# Patient Record
Sex: Female | Born: 1955
Health system: Southern US, Community
[De-identification: ages and names within clinical notes are randomized; demographics above are authoritative.]

## PROBLEM LIST (undated history)

## (undated) DIAGNOSIS — I10 Essential (primary) hypertension: Secondary | ICD-10-CM

## (undated) DIAGNOSIS — R896 Abnormal cytological findings in specimens from other organs, systems and tissues: Secondary | ICD-10-CM

## (undated) DIAGNOSIS — A048 Other specified bacterial intestinal infections: Secondary | ICD-10-CM

## (undated) DIAGNOSIS — K219 Gastro-esophageal reflux disease without esophagitis: Secondary | ICD-10-CM

## (undated) HISTORY — DX: Gastro-esophageal reflux disease without esophagitis: K21.9

## (undated) HISTORY — DX: Other specified bacterial intestinal infections: A04.8

## (undated) HISTORY — DX: Abnormal cytological findings in specimens from other organs, systems and tissues: R89.6

## (undated) HISTORY — DX: Essential (primary) hypertension: I10

---

## 1995-05-24 DIAGNOSIS — IMO0001 Reserved for inherently not codable concepts without codable children: Secondary | ICD-10-CM

## 1995-05-24 HISTORY — DX: Reserved for inherently not codable concepts without codable children: IMO0001

## 1999-06-03 ENCOUNTER — Other Ambulatory Visit: Admission: RE | Admit: 1999-06-03 | Discharge: 1999-06-03 | Payer: Self-pay | Admitting: Family Medicine

## 2000-07-18 ENCOUNTER — Other Ambulatory Visit: Admission: RE | Admit: 2000-07-18 | Discharge: 2000-07-18 | Payer: Self-pay | Admitting: Family Medicine

## 2001-08-02 ENCOUNTER — Other Ambulatory Visit: Admission: RE | Admit: 2001-08-02 | Discharge: 2001-08-02 | Payer: Self-pay | Admitting: Family Medicine

## 2002-10-25 ENCOUNTER — Other Ambulatory Visit: Admission: RE | Admit: 2002-10-25 | Discharge: 2002-10-25 | Payer: Self-pay | Admitting: Family Medicine

## 2004-04-07 ENCOUNTER — Other Ambulatory Visit: Admission: RE | Admit: 2004-04-07 | Discharge: 2004-04-07 | Payer: Self-pay | Admitting: Family Medicine

## 2004-04-07 ENCOUNTER — Ambulatory Visit: Payer: Self-pay | Admitting: Family Medicine

## 2004-06-01 ENCOUNTER — Ambulatory Visit: Payer: Self-pay | Admitting: Family Medicine

## 2004-07-14 ENCOUNTER — Ambulatory Visit: Payer: Self-pay | Admitting: Family Medicine

## 2004-07-28 ENCOUNTER — Ambulatory Visit: Payer: Self-pay | Admitting: Family Medicine

## 2004-08-26 ENCOUNTER — Ambulatory Visit: Payer: Self-pay | Admitting: Family Medicine

## 2004-11-24 ENCOUNTER — Ambulatory Visit: Payer: Self-pay | Admitting: Family Medicine

## 2005-08-18 ENCOUNTER — Encounter: Payer: Self-pay | Admitting: Family Medicine

## 2005-08-18 ENCOUNTER — Ambulatory Visit: Payer: Self-pay | Admitting: Family Medicine

## 2005-08-18 ENCOUNTER — Other Ambulatory Visit: Admission: RE | Admit: 2005-08-18 | Discharge: 2005-08-18 | Payer: Self-pay | Admitting: Family Medicine

## 2005-08-18 LAB — CONVERTED CEMR LAB: Pap Smear: NORMAL

## 2005-09-02 ENCOUNTER — Ambulatory Visit: Payer: Self-pay | Admitting: Family Medicine

## 2005-10-06 ENCOUNTER — Ambulatory Visit: Payer: Self-pay | Admitting: Family Medicine

## 2007-01-01 ENCOUNTER — Encounter: Payer: Self-pay | Admitting: Family Medicine

## 2007-01-01 DIAGNOSIS — K219 Gastro-esophageal reflux disease without esophagitis: Secondary | ICD-10-CM | POA: Insufficient documentation

## 2007-01-01 DIAGNOSIS — I10 Essential (primary) hypertension: Secondary | ICD-10-CM | POA: Insufficient documentation

## 2007-02-21 ENCOUNTER — Ambulatory Visit: Payer: Self-pay | Admitting: Family Medicine

## 2007-02-21 ENCOUNTER — Encounter: Payer: Self-pay | Admitting: Family Medicine

## 2007-02-21 ENCOUNTER — Other Ambulatory Visit: Admission: RE | Admit: 2007-02-21 | Discharge: 2007-02-21 | Payer: Self-pay | Admitting: Family Medicine

## 2007-02-23 LAB — CONVERTED CEMR LAB
ALT: 11 units/L (ref 0–35)
AST: 19 units/L (ref 0–37)
Albumin: 3.9 g/dL (ref 3.5–5.2)
Alkaline Phosphatase: 62 units/L (ref 39–117)
BUN: 10 mg/dL (ref 6–23)
Basophils Absolute: 0 10*3/uL (ref 0.0–0.1)
Basophils Relative: 0 % (ref 0.0–1.0)
Bilirubin, Direct: 0.1 mg/dL (ref 0.0–0.3)
CO2: 34 meq/L — ABNORMAL HIGH (ref 19–32)
Calcium: 9.5 mg/dL (ref 8.4–10.5)
Chloride: 102 meq/L (ref 96–112)
Cholesterol: 192 mg/dL (ref 0–200)
Creatinine, Ser: 0.8 mg/dL (ref 0.4–1.2)
Eosinophils Absolute: 0.1 10*3/uL (ref 0.0–0.6)
Eosinophils Relative: 1.6 % (ref 0.0–5.0)
GFR calc Af Amer: 98 mL/min
GFR calc non Af Amer: 81 mL/min
Glucose, Bld: 86 mg/dL (ref 70–99)
HCT: 37.9 % (ref 36.0–46.0)
HDL: 58.8 mg/dL (ref >39.0)
Hemoglobin: 13.3 g/dL (ref 12.0–15.0)
LDL Cholesterol: 122 mg/dL — ABNORMAL HIGH (ref 0–99)
Lymphocytes Relative: 45.2 % (ref 12.0–46.0)
MCHC: 35.1 g/dL (ref 30.0–36.0)
MCV: 92.8 fL (ref 78.0–100.0)
Monocytes Absolute: 0.4 10*3/uL (ref 0.2–0.7)
Monocytes Relative: 8.1 % (ref 3.0–11.0)
Neutro Abs: 2.1 10*3/uL (ref 1.4–7.7)
Neutrophils Relative %: 45.1 % (ref 43.0–77.0)
Platelets: 306 10*3/uL (ref 150–400)
Potassium: 3.6 meq/L (ref 3.5–5.1)
RBC: 4.09 M/uL (ref 3.87–5.11)
RDW: 12.6 % (ref 11.5–14.6)
Sodium: 141 meq/L (ref 135–145)
TSH: 2.4 microintl units/mL (ref 0.35–5.50)
Total Bilirubin: 1.5 mg/dL — ABNORMAL HIGH (ref 0.3–1.2)
Total CHOL/HDL Ratio: 3.3
Total Protein: 7.4 g/dL (ref 6.0–8.3)
Triglycerides: 58 mg/dL (ref 0–149)
VLDL: 12 mg/dL (ref 0–40)
WBC: 4.7 10*3/uL (ref 4.5–10.5)

## 2007-02-26 ENCOUNTER — Encounter (INDEPENDENT_AMBULATORY_CARE_PROVIDER_SITE_OTHER): Payer: Self-pay | Admitting: *Deleted

## 2007-02-26 LAB — CONVERTED CEMR LAB: Pap Smear: NORMAL

## 2007-03-13 ENCOUNTER — Ambulatory Visit: Payer: Self-pay | Admitting: Family Medicine

## 2007-03-14 ENCOUNTER — Encounter (INDEPENDENT_AMBULATORY_CARE_PROVIDER_SITE_OTHER): Payer: Self-pay | Admitting: *Deleted

## 2007-03-28 ENCOUNTER — Ambulatory Visit: Payer: Self-pay | Admitting: Family Medicine

## 2007-03-28 ENCOUNTER — Encounter: Payer: Self-pay | Admitting: Family Medicine

## 2007-03-30 ENCOUNTER — Encounter (INDEPENDENT_AMBULATORY_CARE_PROVIDER_SITE_OTHER): Payer: Self-pay | Admitting: *Deleted

## 2007-04-24 ENCOUNTER — Ambulatory Visit: Payer: Self-pay | Admitting: Family Medicine

## 2007-04-24 DIAGNOSIS — R740 Nonspecific elevation of levels of transaminase and lactic acid dehydrogenase [LDH]: Secondary | ICD-10-CM

## 2007-04-24 DIAGNOSIS — R7402 Elevation of levels of lactic acid dehydrogenase (LDH): Secondary | ICD-10-CM | POA: Insufficient documentation

## 2007-04-24 DIAGNOSIS — R7401 Elevation of levels of liver transaminase levels: Secondary | ICD-10-CM | POA: Insufficient documentation

## 2007-04-27 LAB — CONVERTED CEMR LAB
ALT: 16 units/L (ref 0–35)
AST: 26 units/L (ref 0–37)
Albumin: 3.7 g/dL (ref 3.5–5.2)
Alkaline Phosphatase: 59 units/L (ref 39–117)
Bilirubin, Direct: 0.1 mg/dL (ref 0.0–0.3)
Potassium: 3.9 meq/L (ref 3.5–5.1)
Total Bilirubin: 1 mg/dL (ref 0.3–1.2)
Total Protein: 7.2 g/dL (ref 6.0–8.3)

## 2007-09-17 ENCOUNTER — Telehealth: Payer: Self-pay | Admitting: Family Medicine

## 2007-12-13 ENCOUNTER — Telehealth (INDEPENDENT_AMBULATORY_CARE_PROVIDER_SITE_OTHER): Payer: Self-pay | Admitting: *Deleted

## 2008-04-21 ENCOUNTER — Encounter: Payer: Self-pay | Admitting: Family Medicine

## 2008-04-21 ENCOUNTER — Ambulatory Visit: Payer: Self-pay | Admitting: Family Medicine

## 2008-04-21 ENCOUNTER — Other Ambulatory Visit: Admission: RE | Admit: 2008-04-21 | Discharge: 2008-04-21 | Payer: Self-pay | Admitting: Family Medicine

## 2008-04-25 ENCOUNTER — Encounter (INDEPENDENT_AMBULATORY_CARE_PROVIDER_SITE_OTHER): Payer: Self-pay | Admitting: *Deleted

## 2008-04-25 LAB — CONVERTED CEMR LAB
ALT: 12 units/L (ref 0–35)
AST: 20 units/L (ref 0–37)
Albumin: 3.9 g/dL (ref 3.5–5.2)
Alkaline Phosphatase: 66 units/L (ref 39–117)
BUN: 11 mg/dL (ref 6–23)
Basophils Absolute: 0 10*3/uL (ref 0.0–0.1)
Basophils Relative: 0.6 % (ref 0.0–3.0)
Bilirubin, Direct: 0.1 mg/dL (ref 0.0–0.3)
CO2: 32 meq/L (ref 19–32)
Calcium: 9.5 mg/dL (ref 8.4–10.5)
Chloride: 104 meq/L (ref 96–112)
Cholesterol: 181 mg/dL (ref 0–200)
Creatinine, Ser: 0.8 mg/dL (ref 0.4–1.2)
Eosinophils Absolute: 0.1 10*3/uL (ref 0.0–0.7)
Eosinophils Relative: 2.2 % (ref 0.0–5.0)
GFR calc Af Amer: 97 mL/min
GFR calc non Af Amer: 80 mL/min
Glucose, Bld: 81 mg/dL (ref 70–99)
HCT: 38.3 % (ref 36.0–46.0)
HDL: 57.8 mg/dL (ref >39.0)
Hemoglobin: 13 g/dL (ref 12.0–15.0)
LDL Cholesterol: 115 mg/dL — ABNORMAL HIGH (ref 0–99)
Lymphocytes Relative: 44.8 % (ref 12.0–46.0)
MCHC: 33.9 g/dL (ref 30.0–36.0)
MCV: 92.3 fL (ref 78.0–100.0)
Monocytes Absolute: 0.4 10*3/uL (ref 0.1–1.0)
Monocytes Relative: 8.1 % (ref 3.0–12.0)
Neutro Abs: 2.1 10*3/uL (ref 1.4–7.7)
Neutrophils Relative %: 44.3 % (ref 43.0–77.0)
Platelets: 267 10*3/uL (ref 150–400)
Potassium: 3.8 meq/L (ref 3.5–5.1)
RBC: 4.15 M/uL (ref 3.87–5.11)
RDW: 12.5 % (ref 11.5–14.6)
Sodium: 141 meq/L (ref 135–145)
TSH: 2.64 microintl units/mL (ref 0.35–5.50)
Total Bilirubin: 1.3 mg/dL — ABNORMAL HIGH (ref 0.3–1.2)
Total CHOL/HDL Ratio: 3.1
Total Protein: 7.1 g/dL (ref 6.0–8.3)
Triglycerides: 39 mg/dL (ref 0–149)
VLDL: 8 mg/dL (ref 0–40)
WBC: 4.8 10*3/uL (ref 4.5–10.5)

## 2008-05-15 ENCOUNTER — Ambulatory Visit: Payer: Self-pay | Admitting: Family Medicine

## 2008-05-15 LAB — CONVERTED CEMR LAB
OCCULT 1: NEGATIVE
OCCULT 2: NEGATIVE
OCCULT 3: NEGATIVE

## 2008-05-19 ENCOUNTER — Encounter (INDEPENDENT_AMBULATORY_CARE_PROVIDER_SITE_OTHER): Payer: Self-pay | Admitting: *Deleted

## 2008-08-12 ENCOUNTER — Telehealth (INDEPENDENT_AMBULATORY_CARE_PROVIDER_SITE_OTHER): Payer: Self-pay | Admitting: *Deleted

## 2009-05-27 ENCOUNTER — Ambulatory Visit: Payer: Self-pay | Admitting: Family Medicine

## 2009-11-19 ENCOUNTER — Ambulatory Visit: Payer: Self-pay | Admitting: Family Medicine

## 2009-11-19 ENCOUNTER — Other Ambulatory Visit: Admission: RE | Admit: 2009-11-19 | Discharge: 2009-11-19 | Payer: Self-pay | Admitting: Family Medicine

## 2009-11-19 LAB — CONVERTED CEMR LAB
ALT: 12 units/L (ref 0–35)
AST: 23 units/L (ref 0–37)
Albumin: 4.3 g/dL (ref 3.5–5.2)
Alkaline Phosphatase: 68 units/L (ref 39–117)
BUN: 15 mg/dL (ref 6–23)
Basophils Absolute: 0 10*3/uL (ref 0.0–0.1)
Basophils Relative: 0.8 % (ref 0.0–3.0)
Bilirubin, Direct: 0.2 mg/dL (ref 0.0–0.3)
CO2: 32 meq/L (ref 19–32)
Calcium: 9.6 mg/dL (ref 8.4–10.5)
Chloride: 97 meq/L (ref 96–112)
Cholesterol: 201 mg/dL — ABNORMAL HIGH (ref 0–200)
Creatinine, Ser: 0.8 mg/dL (ref 0.4–1.2)
Direct LDL: 125.2 mg/dL
Eosinophils Absolute: 0.1 10*3/uL (ref 0.0–0.7)
Eosinophils Relative: 2.1 % (ref 0.0–5.0)
GFR calc non Af Amer: 91.18 mL/min (ref >60)
Glucose, Bld: 76 mg/dL (ref 70–99)
HCT: 37.2 % (ref 36.0–46.0)
HDL: 58.7 mg/dL (ref >39.00)
Hemoglobin: 12.9 g/dL (ref 12.0–15.0)
Lymphocytes Relative: 42.5 % (ref 12.0–46.0)
Lymphs Abs: 2.2 10*3/uL (ref 0.7–4.0)
MCHC: 34.5 g/dL (ref 30.0–36.0)
MCV: 92.4 fL (ref 78.0–100.0)
Monocytes Absolute: 0.3 10*3/uL (ref 0.1–1.0)
Monocytes Relative: 6.2 % (ref 3.0–12.0)
Neutro Abs: 2.5 10*3/uL (ref 1.4–7.7)
Neutrophils Relative %: 48.4 % (ref 43.0–77.0)
Platelets: 237 10*3/uL (ref 150.0–400.0)
Potassium: 3.8 meq/L (ref 3.5–5.1)
RBC: 4.03 M/uL (ref 3.87–5.11)
RDW: 12.9 % (ref 11.5–14.6)
Sodium: 135 meq/L (ref 135–145)
TSH: 2.17 microintl units/mL (ref 0.35–5.50)
Total Bilirubin: 1.4 mg/dL — ABNORMAL HIGH (ref 0.3–1.2)
Total CHOL/HDL Ratio: 3
Total Protein: 7.5 g/dL (ref 6.0–8.3)
Triglycerides: 51 mg/dL (ref 0.0–149.0)
VLDL: 10.2 mg/dL (ref 0.0–40.0)
WBC: 5.1 10*3/uL (ref 4.5–10.5)

## 2009-11-26 ENCOUNTER — Encounter (INDEPENDENT_AMBULATORY_CARE_PROVIDER_SITE_OTHER): Payer: Self-pay | Admitting: *Deleted

## 2009-11-26 ENCOUNTER — Ambulatory Visit: Payer: Self-pay | Admitting: Family Medicine

## 2009-11-26 LAB — CONVERTED CEMR LAB: Pap Smear: NEGATIVE

## 2009-11-26 LAB — FECAL OCCULT BLOOD, GUAIAC: Fecal Occult Blood: NEGATIVE

## 2009-12-01 ENCOUNTER — Encounter: Payer: Self-pay | Admitting: Family Medicine

## 2009-12-01 LAB — CONVERTED CEMR LAB: Fecal Occult Bld: NEGATIVE

## 2009-12-08 ENCOUNTER — Ambulatory Visit: Payer: Self-pay | Admitting: Family Medicine

## 2009-12-08 ENCOUNTER — Encounter: Payer: Self-pay | Admitting: Family Medicine

## 2009-12-08 LAB — HM MAMMOGRAPHY: HM Mammogram: NORMAL

## 2009-12-11 ENCOUNTER — Encounter: Payer: Self-pay | Admitting: Family Medicine

## 2010-06-02 ENCOUNTER — Ambulatory Visit
Admission: RE | Admit: 2010-06-02 | Discharge: 2010-06-02 | Payer: Self-pay | Source: Home / Self Care | Attending: Family Medicine | Admitting: Family Medicine

## 2010-06-02 DIAGNOSIS — M5430 Sciatica, unspecified side: Secondary | ICD-10-CM | POA: Insufficient documentation

## 2010-06-02 DIAGNOSIS — IMO0002 Reserved for concepts with insufficient information to code with codable children: Secondary | ICD-10-CM | POA: Insufficient documentation

## 2010-06-24 NOTE — Letter (Signed)
Summary: Results Follow up Letter  [place] at [facility]  [address] [place], [place] [address]   Phone: [phone]  Fax: [phone]    12/01/2009 MRN: [id number]    [facility] [place] [address] [place], [place]  [address]    Dear Ms. Teresa Stephens,  The following are the results of your recent test(s):  Test         Result    Pap Smear:        Normal _____  Not Normal _____ Comments: ______________________________________________________ Cholesterol: LDL(Bad cholesterol):         Your goal is less than:         HDL (Good cholesterol):       Your goal is more than: Comments:  ______________________________________________________ Mammogram:        Normal _____  Not Normal _____ Comments:  ___________________________________________________________________ Hemoccult:        Normal __X__  Not normal _______ Comments:    _____________________________________________________________________ Other Tests:    We routinely do not discuss normal results over the telephone.  If you desire a copy of the results, or you have any questions about this information we can discuss them at your next office visit.   Sincerely,    Idamae Schuller Aariz Maish,MD  MT/ri

## 2010-06-24 NOTE — Assessment & Plan Note (Signed)
Summary: COLD/CLE   Vital Signs:  Patient profile:   55 year old female Height:      63.25 inches Weight:      168 pounds BMI:     29.63 Temp:     98.7 degrees F oral Pulse rate:   80 / minute Pulse rhythm:   regular BP sitting:   132 / 82  (left arm) Cuff size:   regular  Vitals Entered By: Sydell Axon LPN (May 27, 2009 2:46 PM) CC: Cold and head congestion   History of Present Illness: Teresa Stephens is a 55 y/o female who presents today with a cold for 5 days.  She has experienced congestion with greenish sputum and sore throat.  Yesterday she began to develop a cough.  The symptoms have been at their worst in the morning.  She denied any fever or headaches.  Problems Prior to Update: 1)  Other Screening Mammogram  (ICD-V76.12) 2)  Nonspec Elevation of Levels of Transaminase/ldh  (ICD-790.4) 3)  Screening For Malignannt Neoplasm, Site Nec  (ICD-V76.49) 4)  Gynecological Examination, Routine  (ICD-V72.31) 5)  Health Maintenance Exam  (ICD-V70.0) 6)  Acid Reflux Disease  (ICD-530.81) 7)  Hypertension  (ICD-401.9)  Medications Prior to Update: 1)  Hydrochlorothiazide 25 Mg Tabs (Hydrochlorothiazide) .Marland Kitchen.. 1 By Mouth Once Daily  Allergies: 1)  Axid 2)  Keflex  Physical Exam  General:  Well-developed,well-nourished,in no acute distress; alert,appropriate and cooperative throughout examination, congested and mildly hoarse. Head:  Normocephalic and atraumatic without obvious abnormalities. No apparent alopecia or balding. Sinuses NT. Eyes:  Conjunctiva clear bilat. Ears:  External ear exam shows no significant lesions or deformities.  Otoscopic examination reveals clear canals, tympanic membranes are intact bilaterally without bulging, retraction, inflammation or discharge. Hearing is grossly normal bilaterally. Mild cerumen bilat. Nose:  External nasal examination shows no deformity or inflammation. Nasal mucosa are pink and moist without lesions or exudates. R nostril  slightly inflamed. Mouth:  Oral mucosa and oropharynx without lesions or exudates.  Teeth in good repair. Neck:  No deformities, masses, or tenderness noted. Lungs:  Normal respiratory effort, chest expands symmetrically. Lungs are clear to auscultation, no crackles or wheezes. Heart:  Normal rate and regular rhythm. S1 and S2 normal without gallop, murmur, click, rub or other extra sounds.   Impression & Recommendations:  Problem # 1:  URI (ICD-465.9) Assessment New See instructions.  Complete Medication List: 1)  Hydrochlorothiazide 25 Mg Tabs (Hydrochlorothiazide) .Marland Kitchen.. 1 by mouth once daily  Patient Instructions: 1)  Take Guaifenesin by [facility] or [facility] and getting MUCOUS RELIEF EXPECTORANT (400mg ), take 11/2 tabs by mouth AM and NOON. 2)  Drink lots of fluids anytime taking Guaifenesin.  3)  Take Tyl ES 2 tabs by mouth three times a day. 4)  Keep lozenge in mouth. 5)  Gargle with warm salt water every 1/2 hr for 2 days. 6)  Call for worsening sxs. 7)  Make appt with Dr Milinda Antis for PE.  Current Allergies (reviewed today): AXID KEFLEX

## 2010-06-24 NOTE — Miscellaneous (Signed)
Summary: Hemoccult screening  Clinical Lists Changes  Observations: Added new observation of HEMOCCULT: negative (11/26/2009 10:12)      Preventive Care Screening  Hemoccult:    Date:  11/26/2009    Results:  negative

## 2010-06-24 NOTE — Assessment & Plan Note (Signed)
Summary: CPX/DLO   Vital Signs:  Patient profile:   55 year old female Height:      63 inches Weight:      168.75 pounds BMI:     30.00 Temp:     97.8 degrees F oral Pulse rate:   68 / minute Pulse rhythm:   regular BP sitting:   142 / 78  (left arm) Cuff size:   regular  Vitals Entered By: Lewanda Rife LPN (November 19, 2009 10:52 AM)  Serial Vital Signs/Assessments:  Time      Position  BP       Pulse  Resp  Temp     By                     131/80                         Judith Part MD  CC: CPX with pap and breast exam LMP 2009   History of Present Illness: here for health mt exam and rev of chronic med problems   feels fairly good overall   is walking in her job a lot and throwing boxes  R leg tingles a bit  knee hurts too  thinks it may be job related - stands all day  she is interested in eval by sports med for this -- ? if pinched nerve in her back  occ a little tingly in R hand too ( is right handed)   wt is stable   bp is 142/78-- has not been high lately , HTN has been very well controlled   pap 11/09  -- wants to get that done  no bleeding or symptoms -- last menses was in 2009  is doing ok with menopause   mam 11/08-- needs to schedule that  self exam - no lumps but hard to tell - some breast tenderness from time to time ( lots of caffiene)  td 6/04  declines colonoscopy -- will do stool cards     Allergies: 1)  Axid 2)  Keflex  Past History:  Past Medical History: Last updated: 04/21/2008 Hypertension GERD past hx H pylori  Past Surgical History: Last updated: 01/01/2007 ASCU&S pap (1997), ok on repeat (02/1997)  Family History: Last updated: 02/21/2007 Father: Kidney disease, HTN,  DM Siblings: 1 half sister Mother: MI, HTN uncle with lung ca  Social History: Last updated: 04/21/2008 Marital Status: Married Children: 2 Occupation: [employer] goes to gym for exercise  non smoker occ glass of wine   Risk Factors: Smoking Status:  never (01/01/2007)  Review of Systems General:  Complains of fatigue; denies loss of appetite, malaise, and sleep disorder. Eyes:  Denies blurring and eye irritation. CV:  Denies chest pain or discomfort, lightheadness, and palpitations. Resp:  Denies cough, shortness of breath, and wheezing. GI:  Denies abdominal pain, bloody stools, change in bowel habits, indigestion, nausea, and vomiting. GU:  Denies dysuria. MS:  Complains of low back pain and stiffness; denies joint redness, joint swelling, cramps, and muscle weakness. Derm:  Denies itching, lesion(s), poor wound healing, and rash. Neuro:  Complains of tingling; denies weakness. Psych:  mood is ok . Endo:  Denies cold intolerance, excessive thirst, excessive urination, and heat intolerance. Heme:  Denies abnormal bruising and bleeding.  Physical Exam  General:  overweight but generally well appearing  Head:  normocephalic, atraumatic, and no abnormalities observed.   Eyes:  vision grossly intact,  pupils equal, pupils round, and pupils reactive to light.  no conjunctival pallor, injection or icterus  Ears:  R ear normal and L ear normal.   Nose:  no nasal discharge.   Mouth:  pharynx pink and moist.   Neck:  supple with full rom and no masses or thyromegally, no JVD or carotid bruit  Chest Wall:  No deformities, masses, or tenderness noted. Breasts:  No mass, nodules, thickening, tenderness, bulging, retraction, inflamation, nipple discharge or skin changes noted.   Lungs:  Normal respiratory effort, chest expands symmetrically. Lungs are clear to auscultation, no crackles or wheezes. Heart:  Normal rate and regular rhythm. S1 and S2 normal without gallop, murmur, click, rub or other extra sounds. Abdomen:  Bowel sounds positive,abdomen soft and non-tender without masses, organomegaly or hernias noted. Genitalia:  Normal introitus for age, no external lesions, no vaginal discharge, mucosa pink and moist, no vaginal or cervical  lesions, no vaginal atrophy, no friaility or hemorrhage, normal uterus size and position, no adnexal masses or tenderness Msk:  No deformity or scoliosis noted of thoracic or lumbar spine.  no acute joint changes  nl rom LS with neg st leg raise  Pulses:  R and L carotid,radial,femoral,dorsalis pedis and posterior tibial pulses are full and equal bilaterally Extremities:  No clubbing, cyanosis, edema, or deformity noted with normal full range of motion of all joints.   Neurologic:  sensation intact to light touch, gait normal, and DTRs symmetrical and normal.   Skin:  Intact without suspicious lesions or rashes Cervical Nodes:  No lymphadenopathy noted Axillary Nodes:  No palpable lymphadenopathy Inguinal Nodes:  No significant adenopathy Psych:  normal affect, talkative and pleasant    Impression & Recommendations:  Problem # 1:  HEALTH MAINTENANCE EXAM (ICD-V70.0) reviewed health habits including diet, exercise and skin cancer prevention reviewed health maintenance list and family history  bp better on second check wellness labs today Orders: Venipuncture (59563) TLB-Lipid Panel (80061-LIPID) TLB-BMP (Basic Metabolic Panel-BMET) (80048-METABOL) TLB-CBC Platelet - w/Differential (85025-CBCD) TLB-Hepatic/Liver Function Pnl (80076-HEPATIC) TLB-TSH (Thyroid Stimulating Hormone) (84443-TSH) Prescription Created Electronically [phone])  Problem # 2:  GYNECOLOGICAL EXAMINATION, ROUTINE (ICD-V72.31)  annual exam with pap no c/o  Orders: Prescription Created Electronically [phone])  Problem # 3:  OTHER SCREENING MAMMOGRAM (ICD-V76.12) Assessment: Comment Only annual mammogram scheduled adv pt to continue regular self breast exams non remarkable breast exam today  Orders: Radiology Referral (Radiology) Prescription Created Electronically [phone])  Problem # 4:  HYPERTENSION (ICD-401.9) Assessment: Unchanged bp better on 2nd check  no change in meds lab today disc healthy diet  (low simple sugar/ choose complex carbs/ low sat fat) diet and exercise in detail  Her updated medication list for this problem includes:    Hydrochlorothiazide 25 Mg Tabs (Hydrochlorothiazide) .Marland Kitchen... 1 by mouth once daily  Orders: Venipuncture (66063) TLB-Lipid Panel (80061-LIPID) TLB-BMP (Basic Metabolic Panel-BMET) (80048-METABOL) TLB-CBC Platelet - w/Differential (85025-CBCD) TLB-Hepatic/Liver Function Pnl (80076-HEPATIC) TLB-TSH (Thyroid Stimulating Hormone) (01601-UXN) Prescription Created Electronically [phone])  Complete Medication List: 1)  Hydrochlorothiazide 25 Mg Tabs (Hydrochlorothiazide) .Marland Kitchen.. 1 by mouth once daily 2)  Advil 200 Mg Tabs (Ibuprofen) .... Otc as directed.  Patient Instructions: 1)  keep working on healthy diet and exercise 2)  we will schedule mammogram at check out  3)  labs today  4)  stool card for colon cancer screening  5)  schedule appt with Dr Patsy Lager for leg tingling and pain  Prescriptions: HYDROCHLOROTHIAZIDE 25 MG TABS (HYDROCHLOROTHIAZIDE) 1 by mouth once daily  #90  x 3   Entered and Authorized by:   Judith Part MD   Signed by:   Judith Part MD on 11/19/2009   Method used:   [facility]        [facility] [phone]* (retail)       [address] [place], [place]  [address]       Ph: [phone] or [phone]       Fax: [phone]   RxID:   [id number]   Current Allergies (reviewed today): AXID [facility]

## 2010-06-24 NOTE — Letter (Signed)
Summary: Results Follow up Letter  [place] at [facility]  [address] [place], [place] [address]   Phone: [phone]  Fax: [phone]    11/26/2009 MRN: [id number]  [facility] [place] [address] [place], [place]  [address]  Dear Ms. Teresa Stephens,  The following are the results of your recent test(s):  Test         Result    Pap Smear:        Normal __X___  Not Normal _____ Comments: ______________________________________________________ Cholesterol: LDL(Bad cholesterol):         Your goal is less than:         HDL (Good cholesterol):       Your goal is more than: Comments:  ______________________________________________________ Mammogram:        Normal _____  Not Normal _____ Comments:  ___________________________________________________________________ Hemoccult:        Normal _____  Not normal _______ Comments:    _____________________________________________________________________ Other Tests:    We routinely do not discuss normal results over the telephone.  If you desire a copy of the results, or you have any questions about this information we can discuss them at your next office visit.   Sincerely,    Roxy Manns, MD

## 2010-06-24 NOTE — Letter (Signed)
Summary: Results Follow up Letter  [place] at [facility]  [address] [place], [place] [address]   Phone: [phone]  Fax: [phone]    12/11/2009 MRN: [id number]    [facility] [place] [address] [place], [place]  [address]    Dear Ms. Teresa Stephens,  The following are the results of your recent test(s):  Test         Result    Pap Smear:        Normal _____  Not Normal _____ Comments: ______________________________________________________ Cholesterol: LDL(Bad cholesterol):         Your goal is less than:         HDL (Good cholesterol):       Your goal is more than: Comments:  ______________________________________________________ Mammogram:        Normal __X__  Not Normal _____ Comments:Repeat in one year.   ___________________________________________________________________ Hemoccult:        Normal _____  Not normal _______ Comments:    _____________________________________________________________________ Other Tests:    We routinely do not discuss normal results over the telephone.  If you desire a copy of the results, or you have any questions about this information we can discuss them at your next office visit.   Sincerely,    Idamae Schuller Elexus Barman,MD  MT/ri

## 2010-06-24 NOTE — Letter (Signed)
Summary: [place] [facility]: Immunoassay Fecal Occult Blood (iFOB) Order Form  [place] at [facility]  [address] [place], [place] [address]   Phone: [phone]  Fax: [phone]      [place] [facility]: Immunoassay Fecal Occult Blood (iFOB) Order Form   November 19, 2009 MRN: [id number]   [facility] [place] 16-Aug-1955   Physicican Name:________Tower_________________  Diagnosis Code:_______________V76.51___________      Judith Part MD

## 2010-06-24 NOTE — Assessment & Plan Note (Signed)
Summary: LEFT LEG PAIN REF BY DR TOWER/CLE   Vital Signs:  Patient profile:   55 year old female Height:      63 inches Weight:      170.75 pounds BMI:     30.36 Temp:     98.0 degrees F oral Pulse rate:   72 / minute Pulse rhythm:   regular BP sitting:   120 / 80  (left arm) Cuff size:   regular  Vitals Entered By: Linde Gillis CMA Duncan Dull) (June 02, 2010 2:01 PM) CC: left leg pain, referral by Dr. Milinda Antis   History of Present Illness: Dr. Milinda Antis has requested a consult for evaluation of left leg pain and tingling for this pleasant 55 year old female:  Left leg pain, aches sometimes,  Lot of walking and on her feet about six and half hours a day, works at [employer] in [place].   Maybe sometimes will get some left posterior leg pain. Now recently started some [facility] arthritis cream.   she has been having intermittent left posterior buttock pain and pain down the posterior aspect of her leg for least 6 months, if not greater than this. She's had no specific trauma that she can recall. She has minimal back pain, but does have buttock pain.  Denies any significant knee pain or hip pain. Denies any groin pain. Has no lateral hip pain. No significant foot or ankle pain.  No numbness. No paresthesias. There is a pain sensation that goes in the posterior aspect of her leg just beyond the knee.    Allergies: 1)  Axid 2)  Keflex  Past History:  Past medical, surgical, family and social histories (including risk factors) reviewed, and no changes noted (except as noted below).  Past Medical History: Reviewed history from 04/21/2008 and no changes required. Hypertension GERD past hx H pylori  Past Surgical History: Reviewed history from 01/01/2007 and no changes required. ASCU&S pap (1997), ok on repeat (02/1997)  Family History: Reviewed history from 02/21/2007 and no changes required. Father: Kidney disease, HTN,  DM Siblings: 1 half sister Mother: MI, HTN uncle  with lung ca  Social History: Reviewed history from 04/21/2008 and no changes required. Marital Status: Married Children: 2 Occupation: [employer] goes to gym for exercise  non smoker occ glass of wine   Review of Systems       REVIEW OF SYSTEMS  GEN: No systemic complaints, no fevers, chills, sweats, or other acute illnesses MSK: Detailed in the HPI GI: tolerating PO intake without difficulty Neuro: No numbness, parasthesias, or tingling associated. Otherwise the pertinent positives of the ROS are noted above.    Physical Exam  Additional Exam:  Gen: Well-developed,well-nourished,in no acute distress; alert,appropriate and cooperative throughout examination HEENT: Normocephalic and atraumatic without obvious abnormalities.  Ears, externally no deformities Pulm: Breathing comfortably in no respiratory distress Range of motion at  the waist: Flexion: minimal  pain Extension: minimal  pain Rotation: minimal  pain  No echymosis or edema Rises to examination table with mild difficulty Gait: minimally antalgic   Inspection/Deformity: N Paraspinus T: Y, mild L  B Ankle Dorsiflexion (L5,4): 5/5 B Great Toe Dorsiflexion (L5,4): 5/5 Heel Walk (L5): WNL Toe Walk (S1): WNL Rise/Squat (L4): WNL, mild pain  SENSORY B Medial Foot (L4): WNL B Dorsum (L5): WNL B Lateral (S1): WNL Light Touch: WNL  REFLEXES Knee (L4): 2+ Ankle (S1): 2+  B SLR, seated: neg B SLR, supine: POS ON THE LEFT AT AROUND 60 DEGREES B  FABER: neg B Reverse FABER: neg B Greater Troch: NT B Log Roll: neg B Stork: NT B Sciatic Notch: NT Leg Lengths: equal   Hip abduction 4+/5 on L and flexion, compared to R some pain   Impression & Recommendations:  Problem # 1:  LUMBAR RADICULOPATHY, LEFT (ICD-724.4) Assessment New Recommendations:  was consistent with lumbar radiculopathy, left side. Provokable straight leg raise. Additionally, I think the patient has some insertional tendinopathy at her hip  flexors and hip abductors, most likely brought on by work. She is also greatly deconditioned.  I gave her basic hip strengthening routine I want her to do for her 4 times a week.  Am also going to start her on for neuropathic pain, and gabapentin, and titrate up the dose. I expect that she likely will do well over time, and at least see an appreciable improvement.  check plain films  cc: Dr. Milinda Antis  Her updated medication list for this problem includes:    Advil 200 Mg Tabs (Ibuprofen) ..... Otc as directed.  Problem # 2:  SCIATICA, LEFT (ICD-724.3) Assessment: New  Her updated medication list for this problem includes:    Advil 200 Mg Tabs (Ibuprofen) ..... Otc as directed.  Orders: T-Lumbar Spine Complete, 5 Views (71110TC)  Complete Medication List: 1)  Hydrochlorothiazide 25 Mg Tabs (Hydrochlorothiazide) .Marland Kitchen.. 1 by mouth once daily 2)  Advil 200 Mg Tabs (Ibuprofen) .... Otc as directed. 3)  Gabapentin 300 Mg Caps (Gabapentin) .Marland Kitchen.. 1 by mouth three times a day  Patient Instructions: 1)  Gabapentin titration: 2)  1 tablet at night for 3 days, then 1 tablet twice a day for 3 days, then start 1 tablet three times a day  3)  Work on your hip exercises 3 or 4 times a week 4)  recheck in 6-8 weeks Prescriptions: GABAPENTIN 300 MG  CAPS (GABAPENTIN) 1 by mouth three times a day  #90 x 3   Entered and Authorized by:   Hannah Beat MD   Signed by:   Hannah Beat MD on 06/02/2010   Method used:   [facility]        [facility] [phone]* (retail)       [address] [place], [place]  [address]       Ph: [phone] or [phone]       Fax: [phone]   RxID:   [id number]    Orders Added: 1)  T-Lumbar Spine Complete, 5 Views [71110TC] 2)  Consultation Level III [36644]    Current Allergies (reviewed today): AXID [facility]

## 2010-06-24 NOTE — Letter (Signed)
Summary: Out of Work  [facility] [employer] [facility] at [facility]  [address] [place], [place] [address]   Phone: [phone]  Fax: [phone]    May 27, 2009   Employee:  Teresa Stephens    To Whom It May Concern:   For Medical reasons, please excuse the above named employee from work for the following dates:  Start:   05/27/2009  End:   05/29/2009  If you need additional information, please feel free to contact our office.         Sincerely,    Shaune Leeks MD

## 2010-06-24 NOTE — Miscellaneous (Signed)
Summary: mammogram screening  Clinical Lists Changes  Observations: Added new observation of MAMMO DUE: 12/2010 (12/11/2009 11:16) Added new observation of MAMMOGRAM: normal (12/08/2009 11:17)      Preventive Care Screening  Mammogram:    Date:  12/08/2009    Next Due:  12/2010    Results:  normal

## 2011-01-28 ENCOUNTER — Other Ambulatory Visit: Payer: Self-pay | Admitting: *Deleted

## 2011-01-28 MED ORDER — HYDROCHLOROTHIAZIDE 25 MG PO TABS: 25.0000 mg | ORAL_TABLET | Freq: Every day | ORAL | Status: DC

## 2011-01-28 NOTE — Telephone Encounter (Signed)
Patient has an appt to see Dr. Milinda Antis 03/2011.

## 2011-03-07 ENCOUNTER — Other Ambulatory Visit (INDEPENDENT_AMBULATORY_CARE_PROVIDER_SITE_OTHER): Payer: Federal, State, Local not specified - PPO

## 2011-03-07 ENCOUNTER — Telehealth: Payer: Self-pay | Admitting: Family Medicine

## 2011-03-07 DIAGNOSIS — Z0000 Encounter for general adult medical examination without abnormal findings: Secondary | ICD-10-CM | POA: Insufficient documentation

## 2011-03-07 DIAGNOSIS — I10 Essential (primary) hypertension: Secondary | ICD-10-CM

## 2011-03-07 LAB — CBC WITH DIFFERENTIAL/PLATELET
Basophils Absolute: 0 10*3/uL (ref 0.0–0.1)
Basophils Relative: 0.5 % (ref 0.0–3.0)
Eosinophils Absolute: 0.2 10*3/uL (ref 0.0–0.7)
Eosinophils Relative: 3.8 % (ref 0.0–5.0)
HCT: 37.7 % (ref 36.0–46.0)
Hemoglobin: 12.8 g/dL (ref 12.0–15.0)
Lymphocytes Relative: 35.7 % (ref 12.0–46.0)
Lymphs Abs: 1.9 10*3/uL (ref 0.7–4.0)
MCHC: 33.8 g/dL (ref 30.0–36.0)
MCV: 91.4 fl (ref 78.0–100.0)
Monocytes Absolute: 0.4 10*3/uL (ref 0.1–1.0)
Monocytes Relative: 7.9 % (ref 3.0–12.0)
Neutro Abs: 2.8 10*3/uL (ref 1.4–7.7)
Neutrophils Relative %: 52.1 % (ref 43.0–77.0)
Platelets: 253 10*3/uL (ref 150.0–400.0)
RBC: 4.13 Mil/uL (ref 3.87–5.11)
RDW: 13.5 % (ref 11.5–14.6)
WBC: 5.4 10*3/uL (ref 4.5–10.5)

## 2011-03-07 LAB — COMPREHENSIVE METABOLIC PANEL
ALT: 11 U/L (ref 0–35)
AST: 18 U/L (ref 0–37)
Albumin: 4.1 g/dL (ref 3.5–5.2)
Alkaline Phosphatase: 66 U/L (ref 39–117)
BUN: 14 mg/dL (ref 6–23)
CO2: 29 mEq/L (ref 19–32)
Calcium: 9.5 mg/dL (ref 8.4–10.5)
Chloride: 104 mEq/L (ref 96–112)
Creatinine, Ser: 0.8 mg/dL (ref 0.4–1.2)
GFR: 95.99 mL/min (ref >60.00)
Glucose, Bld: 89 mg/dL (ref 70–99)
Potassium: 3.6 mEq/L (ref 3.5–5.1)
Sodium: 140 mEq/L (ref 135–145)
Total Bilirubin: 0.9 mg/dL (ref 0.3–1.2)
Total Protein: 7.5 g/dL (ref 6.0–8.3)

## 2011-03-07 LAB — LIPID PANEL
Cholesterol: 187 mg/dL (ref 0–200)
HDL: 66.3 mg/dL (ref >39.00)
LDL Cholesterol: 111 mg/dL — ABNORMAL HIGH (ref 0–99)
Total CHOL/HDL Ratio: 3
Triglycerides: 50 mg/dL (ref 0.0–149.0)
VLDL: 10 mg/dL (ref 0.0–40.0)

## 2011-03-07 LAB — TSH: TSH: 2.4 u[IU]/mL (ref 0.35–5.50)

## 2011-03-07 NOTE — Telephone Encounter (Signed)
Message copied by Judy Pimple on Mon Mar 07, 2011 10:08 AM ------      Message from: Alvina Chou      Created: Mon Mar 07, 2011 10:03 AM      Regarding: here now for labs       Patient is scheduled for CPX labs, please order future labs, Thanks , Camelia Eng

## 2011-04-11 ENCOUNTER — Encounter: Payer: Self-pay | Admitting: Family Medicine

## 2011-04-12 ENCOUNTER — Ambulatory Visit (INDEPENDENT_AMBULATORY_CARE_PROVIDER_SITE_OTHER): Payer: Federal, State, Local not specified - PPO | Admitting: Family Medicine

## 2011-04-12 ENCOUNTER — Encounter: Payer: Self-pay | Admitting: Family Medicine

## 2011-04-12 VITALS — BP 138/72 | HR 76 | Temp 97.2°F | Ht 62.5 in | Wt 169.0 lb

## 2011-04-12 DIAGNOSIS — Z1211 Encounter for screening for malignant neoplasm of colon: Secondary | ICD-10-CM

## 2011-04-12 DIAGNOSIS — Z1231 Encounter for screening mammogram for malignant neoplasm of breast: Secondary | ICD-10-CM

## 2011-04-12 DIAGNOSIS — Z23 Encounter for immunization: Secondary | ICD-10-CM

## 2011-04-12 DIAGNOSIS — Z0000 Encounter for general adult medical examination without abnormal findings: Secondary | ICD-10-CM

## 2011-04-12 DIAGNOSIS — I10 Essential (primary) hypertension: Secondary | ICD-10-CM

## 2011-04-12 MED ORDER — HYDROCHLOROTHIAZIDE 25 MG PO TABS: 25.0000 mg | ORAL_TABLET | Freq: Every day | ORAL | Status: DC

## 2011-04-12 NOTE — Patient Instructions (Signed)
We will schedule mammogram at check out Please do stool card for colon cancer screening  Flu shot today Try to work in some extra exercise 5 days per week

## 2011-04-12 NOTE — Progress Notes (Signed)
Subjective:    Patient ID: Teresa Stephens, female    DOB: 26-Jul-1955, 55 y.o.   MRN: [id number]  HPI Here for annual health mt exam and also to rev chronic med problems Is feeling good  No new medical problems   Wt is stable with bmi of 30  bp is 138/72 No problems with hctz-- no problems with that    Chemistry      Component Value Date/Time   NA 140 03/07/2011 1017   K 3.6 03/07/2011 1017   CL 104 03/07/2011 1017   CO2 29 03/07/2011 1017   BUN 14 03/07/2011 1017   CREATININE 0.8 03/07/2011 1017      Component Value Date/Time   CALCIUM 9.5 03/07/2011 1017   ALKPHOS 66 03/07/2011 1017   AST 18 03/07/2011 1017   ALT 11 03/07/2011 1017   BILITOT 0.9 03/07/2011 1017      Lab Results  Component Value Date   CHOL 187 03/07/2011   HDL 66.30 03/07/2011   LDLCALC 111* 03/07/2011   LDLDIRECT 125.2 11/19/2009   TRIG 50.0 03/07/2011   CHOLHDL 3 03/07/2011   overall good profile  Diet - is eating very healthy- lots of salads  Exercise- very active job but no extra lately   Pap 6/11 normal No new partners and no symptoms  Last 3 normal and no hpv  Mam 7/11 needs to schedule that  Self exam - no lumps or problems   Colon screen - does not want colonoscopy  Will do ifob   Flu shot- does not take them - will do this year  Td 04  Patient Active Problem List  Diagnoses  . HYPERTENSION  . ACID REFLUX DISEASE  . NONSPEC ELEVATION OF LEVELS OF TRANSAMINASE/LDH  . SCIATICA, LEFT  . LUMBAR RADICULOPATHY, LEFT  . Routine general medical examination at a health care facility  . Other screening mammogram   Past Medical History  Diagnosis Date  . HTN (hypertension)   . GERD (gastroesophageal reflux disease)   . Helicobacter pylori infection     past history  . ASCUS (atypical squamous cells of undetermined significance) on Pap smear 1997    ok on repeat (10/98)   No past surgical history on file. History  Substance Use Topics  . Smoking status: Never Smoker   .  Smokeless tobacco: Not on file  . Alcohol Use: Yes     Occasional wine   Family History  Problem Relation Age of Onset  . Kidney disease Father   . Hypertension Father   . Diabetes Father   . Heart attack Mother   . Hypertension Mother   . Lung cancer      uncle   Allergies  Allergen Reactions  . Cephalexin     REACTION: GI  . Nizatidine     REACTION: nausea   Current Outpatient Prescriptions on File Prior to Visit  Medication Sig Dispense Refill  . ibuprofen (ADVIL,MOTRIN) 200 MG tablet Take 200 mg by mouth every 6 (six) hours as needed.             Review of Systems Review of Systems  Constitutional: Negative for fever, appetite change, fatigue and unexpected weight change.  Eyes: Negative for pain and visual disturbance.  Respiratory: Negative for cough and shortness of breath.   Cardiovascular: Negative for cp or palpitations    Gastrointestinal: Negative for nausea, diarrhea and constipation.  Genitourinary: Negative for urgency and frequency.  Skin: Negative for pallor or rash  Neurological: Negative for weakness, light-headedness, numbness and headaches.  Hematological: Negative for adenopathy. Does not bruise/bleed easily.  Psychiatric/Behavioral: Negative for dysphoric mood. The patient is not nervous/anxious.          Objective:   Physical Exam  Constitutional: She appears well-developed and well-nourished. No distress.       overwt and well appearing   HENT:  Head: Normocephalic and atraumatic.  Right Ear: External ear normal.  Left Ear: External ear normal.  Nose: Nose normal.  Mouth/Throat: Oropharynx is clear and moist.  Eyes: Conjunctivae and EOM are normal. Pupils are equal, round, and reactive to light. No scleral icterus.  Neck: Normal range of motion. Neck supple. No JVD present. Carotid bruit is not present. No thyromegaly present.  Cardiovascular: Normal rate, regular rhythm, normal heart sounds and intact distal pulses.  Exam reveals no  gallop.   Pulmonary/Chest: Effort normal and breath sounds normal. No respiratory distress. She has no wheezes. She exhibits no tenderness.  Abdominal: Soft. Bowel sounds are normal. She exhibits no distension, no abdominal bruit and no mass. There is no tenderness.  Genitourinary: No breast swelling, tenderness, discharge or bleeding.  Musculoskeletal: Normal range of motion. She exhibits no edema and no tenderness.  Lymphadenopathy:    She has no cervical adenopathy.  Neurological: She is alert. She has normal reflexes. No cranial nerve deficit. She exhibits normal muscle tone. Coordination normal.  Skin: Skin is warm and dry. No rash noted. No erythema. No pallor.  Psychiatric: She has a normal mood and affect.          Assessment & Plan:

## 2011-04-12 NOTE — Assessment & Plan Note (Signed)
Scheduled annual screening mammogram Nl breast exam today  Encouraged monthly self exams   

## 2011-04-12 NOTE — Assessment & Plan Note (Signed)
Reviewed health habits including diet and exercise and skin cancer prevention Also reviewed health mt list, fam hx and immunizations   Rev wellness labs today Flu shot today

## 2011-04-12 NOTE — Assessment & Plan Note (Signed)
Doing well with hctz alone Nl labs bp in fair control at this time  No changes needed  Disc lifstyle change with low sodium diet and exercise

## 2011-05-25 ENCOUNTER — Other Ambulatory Visit: Payer: Self-pay | Admitting: Family Medicine

## 2011-05-25 ENCOUNTER — Other Ambulatory Visit: Payer: Federal, State, Local not specified - PPO

## 2011-05-25 LAB — FECAL OCCULT BLOOD, IMMUNOCHEMICAL: Fecal Occult Bld: NEGATIVE

## 2011-05-26 ENCOUNTER — Encounter: Payer: Self-pay | Admitting: *Deleted

## 2011-07-21 ENCOUNTER — Ambulatory Visit: Payer: Self-pay | Admitting: Family Medicine

## 2011-07-22 ENCOUNTER — Encounter: Payer: Self-pay | Admitting: Family Medicine

## 2011-07-24 LAB — HM MAMMOGRAPHY: HM Mammogram: NORMAL

## 2011-07-26 ENCOUNTER — Encounter: Payer: Self-pay | Admitting: Family Medicine

## 2011-07-26 ENCOUNTER — Encounter: Payer: Self-pay | Admitting: *Deleted

## 2012-04-13 ENCOUNTER — Other Ambulatory Visit: Payer: Self-pay | Admitting: Family Medicine

## 2012-08-15 ENCOUNTER — Telehealth: Payer: Self-pay | Admitting: Family Medicine

## 2012-08-15 DIAGNOSIS — Z0000 Encounter for general adult medical examination without abnormal findings: Secondary | ICD-10-CM

## 2012-08-15 NOTE — Telephone Encounter (Signed)
Message copied by Judy Pimple on Wed Aug 15, 2012  8:33 AM ------      Message from: Alvina Chou      Created: Mon Aug 13, 2012  4:01 PM      Regarding: Lab orders for Thursday, 3.27.14       Patient is scheduled for CPX labs, please order future labs, Thanks , Terri       ------

## 2012-08-16 ENCOUNTER — Other Ambulatory Visit (INDEPENDENT_AMBULATORY_CARE_PROVIDER_SITE_OTHER): Payer: Federal, State, Local not specified - PPO

## 2012-08-16 DIAGNOSIS — Z0000 Encounter for general adult medical examination without abnormal findings: Secondary | ICD-10-CM

## 2012-08-16 LAB — CBC WITH DIFFERENTIAL/PLATELET
Basophils Absolute: 0 10*3/uL (ref 0.0–0.1)
Basophils Relative: 0.5 % (ref 0.0–3.0)
Eosinophils Absolute: 0.1 10*3/uL (ref 0.0–0.7)
Eosinophils Relative: 2.8 % (ref 0.0–5.0)
HCT: 40.3 % (ref 36.0–46.0)
Hemoglobin: 13.4 g/dL (ref 12.0–15.0)
Lymphocytes Relative: 39 % (ref 12.0–46.0)
Lymphs Abs: 1.7 10*3/uL (ref 0.7–4.0)
MCHC: 33.3 g/dL (ref 30.0–36.0)
MCV: 91.6 fl (ref 78.0–100.0)
Monocytes Absolute: 0.4 10*3/uL (ref 0.1–1.0)
Monocytes Relative: 8 % (ref 3.0–12.0)
Neutro Abs: 2.2 10*3/uL (ref 1.4–7.7)
Neutrophils Relative %: 49.7 % (ref 43.0–77.0)
Platelets: 292 10*3/uL (ref 150.0–400.0)
RBC: 4.4 Mil/uL (ref 3.87–5.11)
RDW: 13.3 % (ref 11.5–14.6)
WBC: 4.4 10*3/uL — ABNORMAL LOW (ref 4.5–10.5)

## 2012-08-16 LAB — COMPREHENSIVE METABOLIC PANEL
ALT: 11 U/L (ref 0–35)
AST: 19 U/L (ref 0–37)
Albumin: 4 g/dL (ref 3.5–5.2)
Alkaline Phosphatase: 67 U/L (ref 39–117)
BUN: 13 mg/dL (ref 6–23)
CO2: 31 mEq/L (ref 19–32)
Calcium: 9.3 mg/dL (ref 8.4–10.5)
Chloride: 100 mEq/L (ref 96–112)
Creatinine, Ser: 0.8 mg/dL (ref 0.4–1.2)
GFR: 91.51 mL/min (ref >60.00)
Glucose, Bld: 84 mg/dL (ref 70–99)
Potassium: 3.8 mEq/L (ref 3.5–5.1)
Sodium: 138 mEq/L (ref 135–145)
Total Bilirubin: 1.2 mg/dL (ref 0.3–1.2)
Total Protein: 7.2 g/dL (ref 6.0–8.3)

## 2012-08-16 LAB — LIPID PANEL
Cholesterol: 192 mg/dL (ref 0–200)
HDL: 55.2 mg/dL (ref >39.00)
LDL Cholesterol: 126 mg/dL — ABNORMAL HIGH (ref 0–99)
Total CHOL/HDL Ratio: 3
Triglycerides: 53 mg/dL (ref 0.0–149.0)
VLDL: 10.6 mg/dL (ref 0.0–40.0)

## 2012-08-16 LAB — TSH: TSH: 0.6 u[IU]/mL (ref 0.35–5.50)

## 2012-08-21 ENCOUNTER — Encounter: Payer: Self-pay | Admitting: Family Medicine

## 2012-08-21 ENCOUNTER — Other Ambulatory Visit (HOSPITAL_COMMUNITY)
Admission: RE | Admit: 2012-08-21 | Discharge: 2012-08-21 | Disposition: A | Discharge status: Home / Self Care | Payer: Federal, State, Local not specified - PPO | Source: Ambulatory Visit | Attending: Family Medicine | Admitting: Family Medicine

## 2012-08-21 ENCOUNTER — Ambulatory Visit (INDEPENDENT_AMBULATORY_CARE_PROVIDER_SITE_OTHER): Payer: Federal, State, Local not specified - PPO | Admitting: Family Medicine

## 2012-08-21 VITALS — BP 120/70 | HR 78 | Temp 97.5°F | Ht 63.0 in | Wt 176.2 lb

## 2012-08-21 DIAGNOSIS — Z23 Encounter for immunization: Secondary | ICD-10-CM

## 2012-08-21 DIAGNOSIS — Z0000 Encounter for general adult medical examination without abnormal findings: Secondary | ICD-10-CM

## 2012-08-21 DIAGNOSIS — I10 Essential (primary) hypertension: Secondary | ICD-10-CM

## 2012-08-21 DIAGNOSIS — Z1211 Encounter for screening for malignant neoplasm of colon: Secondary | ICD-10-CM | POA: Insufficient documentation

## 2012-08-21 DIAGNOSIS — Z01419 Encounter for gynecological examination (general) (routine) without abnormal findings: Secondary | ICD-10-CM | POA: Insufficient documentation

## 2012-08-21 MED ORDER — HYDROCHLOROTHIAZIDE 25 MG PO TABS: 25.0000 mg | ORAL_TABLET | Freq: Every day | ORAL | Status: DC

## 2012-08-21 NOTE — Patient Instructions (Addendum)
Pap done today  Tdap (tetanus) vaccine today  Make sure to schedule your mammogram Try to get some exercise  Take care of yourself

## 2012-08-21 NOTE — Assessment & Plan Note (Signed)
bp in fair control at this time  No changes needed  Disc lifstyle change with low sodium diet and exercise  Reviewed labs today

## 2012-08-21 NOTE — Progress Notes (Signed)
Subjective:    Patient ID: Teresa Stephens, female    DOB: Sep 21, 1955, 57 y.o.   MRN: [id number]  HPI Here for health maintenance exam and to review chronic medical problems    Is doing fair  Husband has been dx with cancer since her last visit here ( a rare kind)  He is going through chemo and she is taking care of him  She is worried - but doing ok overall  Having issues with FMLA- work is demanding and she tries not to take too much time off  Not a lot of time to care for herself -she tries   Wt is up 7 lb with bmi of 31 Is not happy with that  She needs to cut her portions  Just started going back to exercise the last 2-3 weeks- this also helps stress level   Colon ca screen - no colonoscopy and declines thus far  No stool changes Will do IFOB card   Flu vaccine- did not get   mammo 3/13 Self exam - no breast lumps   Pap 6/11 Does get vaginal dryness and painful intercourse  Will do exam today  Td 6/04- due for that   Hyperlipidemia  Lab Results  Component Value Date   CHOL 192 08/16/2012   CHOL 187 03/07/2011   CHOL 201* 11/19/2009   Lab Results  Component Value Date   HDL 55.20 08/16/2012   HDL 46.96 03/07/2011   HDL 58.70 11/19/2009   Lab Results  Component Value Date   LDLCALC 126* 08/16/2012   LDLCALC 111* 03/07/2011   LDLCALC 115* 04/21/2008   Lab Results  Component Value Date   TRIG 53.0 08/16/2012   TRIG 50.0 03/07/2011   TRIG 51.0 11/19/2009   Lab Results  Component Value Date   CHOLHDL 3 08/16/2012   CHOLHDL 3 03/07/2011   CHOLHDL 3 11/19/2009   Lab Results  Component Value Date   LDLDIRECT 125.2 11/19/2009   cholesterol is ok - good ratio   bp is good 120/70- good   Patient Active Problem List  Diagnosis  . HYPERTENSION  . ACID REFLUX DISEASE  . NONSPEC ELEVATION OF LEVELS OF TRANSAMINASE/LDH  . SCIATICA, LEFT  . LUMBAR RADICULOPATHY, LEFT  . Routine general medical examination at a health care facility  . Other screening mammogram    Past Medical History  Diagnosis Date  . HTN (hypertension)   . GERD (gastroesophageal reflux disease)   . Helicobacter pylori infection     past history  . ASCUS (atypical squamous cells of undetermined significance) on Pap smear 1997    ok on repeat (10/98)   No past surgical history on file. History  Substance Use Topics  . Smoking status: Never Smoker   . Smokeless tobacco: Not on file  . Alcohol Use: Yes     Comment: Occasional wine   Family History  Problem Relation Age of Onset  . Kidney disease Father   . Hypertension Father   . Diabetes Father   . Heart attack Mother   . Hypertension Mother   . Lung cancer      uncle   Allergies  Allergen Reactions  . Cephalexin     REACTION: GI  . Nizatidine     REACTION: nausea   Current Outpatient Prescriptions on File Prior to Visit  Medication Sig Dispense Refill  . hydrochlorothiazide (HYDRODIURIL) 25 MG tablet TAKE 1 TABLET BY MOUTH EVERY DAY  90 tablet  1  . ibuprofen (ADVIL,MOTRIN)  200 MG tablet Take 200 mg by mouth every 6 (six) hours as needed.         No current facility-administered medications on file prior to visit.      Review of Systems Review of Systems  Constitutional: Negative for fever, appetite change, fatigue and unexpected weight change.  Eyes: Negative for pain and visual disturbance.  Respiratory: Negative for cough and shortness of breath.   Cardiovascular: Negative for cp or palpitations    Gastrointestinal: Negative for nausea, diarrhea and constipation.  Genitourinary: Negative for urgency and frequency.  Skin: Negative for pallor or rash   Neurological: Negative for weakness, light-headedness, numbness and headaches.  Hematological: Negative for adenopathy. Does not bruise/bleed easily.  Psychiatric/Behavioral: Negative for dysphoric mood. The patient is not nervous/anxious.         Objective:   Physical Exam  Constitutional: She appears well-developed and well-nourished. No  distress.  obese and well appearing   HENT:  Head: Normocephalic and atraumatic.  Right Ear: External ear normal.  Left Ear: External ear normal.  Nose: Nose normal.  Mouth/Throat: Oropharynx is clear and moist.  Scant cerumen  Eyes: Conjunctivae and EOM are normal. Pupils are equal, round, and reactive to light. Right eye exhibits no discharge. Left eye exhibits no discharge.  Neck: Normal range of motion. Neck supple. No JVD present. Carotid bruit is not present. No thyromegaly present.  Cardiovascular: Normal rate, regular rhythm, normal heart sounds and intact distal pulses.  Exam reveals no gallop.   Pulmonary/Chest: Effort normal and breath sounds normal. No respiratory distress. She has no wheezes.  Abdominal: Soft. Bowel sounds are normal. She exhibits no distension, no abdominal bruit and no mass. There is no tenderness.  Genitourinary: Rectum normal, vagina normal and uterus normal. No breast swelling, tenderness, discharge or bleeding. There is no rash, tenderness or lesion on the right labia. There is no rash, tenderness or lesion on the left labia. Uterus is not enlarged and not tender. Cervix exhibits no motion tenderness, no discharge and no friability. Right adnexum displays no mass, no tenderness and no fullness. Left adnexum displays no mass, no tenderness and no fullness. No erythema or bleeding around the vagina. No vaginal discharge found.  Breast exam: No mass, nodules, thickening, tenderness, bulging, retraction, inflamation, nipple discharge or skin changes noted.  No axillary or clavicular LA.  Chaperoned exam.    Musculoskeletal: She exhibits no edema and no tenderness.  Lymphadenopathy:    She has no cervical adenopathy.  Neurological: She is alert. She has normal reflexes. No cranial nerve deficit. She exhibits normal muscle tone. Coordination normal.  Skin: Skin is warm and dry. No rash noted. No erythema. No pallor.  Psychiatric: She has a normal mood and affect.           Assessment & Plan:

## 2012-08-21 NOTE — Assessment & Plan Note (Signed)
Routine exam with pap  No c/o except for post menopausal vaginal dryness Unremarkable exam

## 2012-08-21 NOTE — Assessment & Plan Note (Signed)
Reviewed health habits including diet and exercise and skin cancer prevention Also reviewed health mt list, fam hx and immunizations  Pt will schedule her own mammogram and do self exams Wellness labs rev Disc need for wt loss  Glad she started exercise Stressed but mood is good and positive

## 2012-08-21 NOTE — Assessment & Plan Note (Signed)
Pt declines colonosc- will do IFOB

## 2012-08-24 ENCOUNTER — Encounter: Payer: Self-pay | Admitting: *Deleted

## 2012-09-04 ENCOUNTER — Other Ambulatory Visit (INDEPENDENT_AMBULATORY_CARE_PROVIDER_SITE_OTHER): Payer: Federal, State, Local not specified - PPO

## 2012-09-04 DIAGNOSIS — Z1211 Encounter for screening for malignant neoplasm of colon: Secondary | ICD-10-CM

## 2012-09-04 LAB — FECAL OCCULT BLOOD, IMMUNOCHEMICAL: Fecal Occult Bld: NEGATIVE

## 2012-09-05 ENCOUNTER — Encounter: Payer: Self-pay | Admitting: *Deleted

## 2013-01-04 ENCOUNTER — Other Ambulatory Visit: Payer: Self-pay | Admitting: Family Medicine

## 2013-02-07 IMAGING — MG MAM DGTL SCRN MAM NO ORDER W/CAD
1 series · 4 of 4 positions shown · non-contrast
Comparison: none

REASON FOR EXAM: scr mammo no order
COMMENTS:

PROCEDURE:     MAM - MAM DGTL SCRN MAM NO ORDER W/CAD  - July 21, 2011  [DATE]
RESULT:

[R CC · right · 4 of 4 slices shown]
[im 1/4]
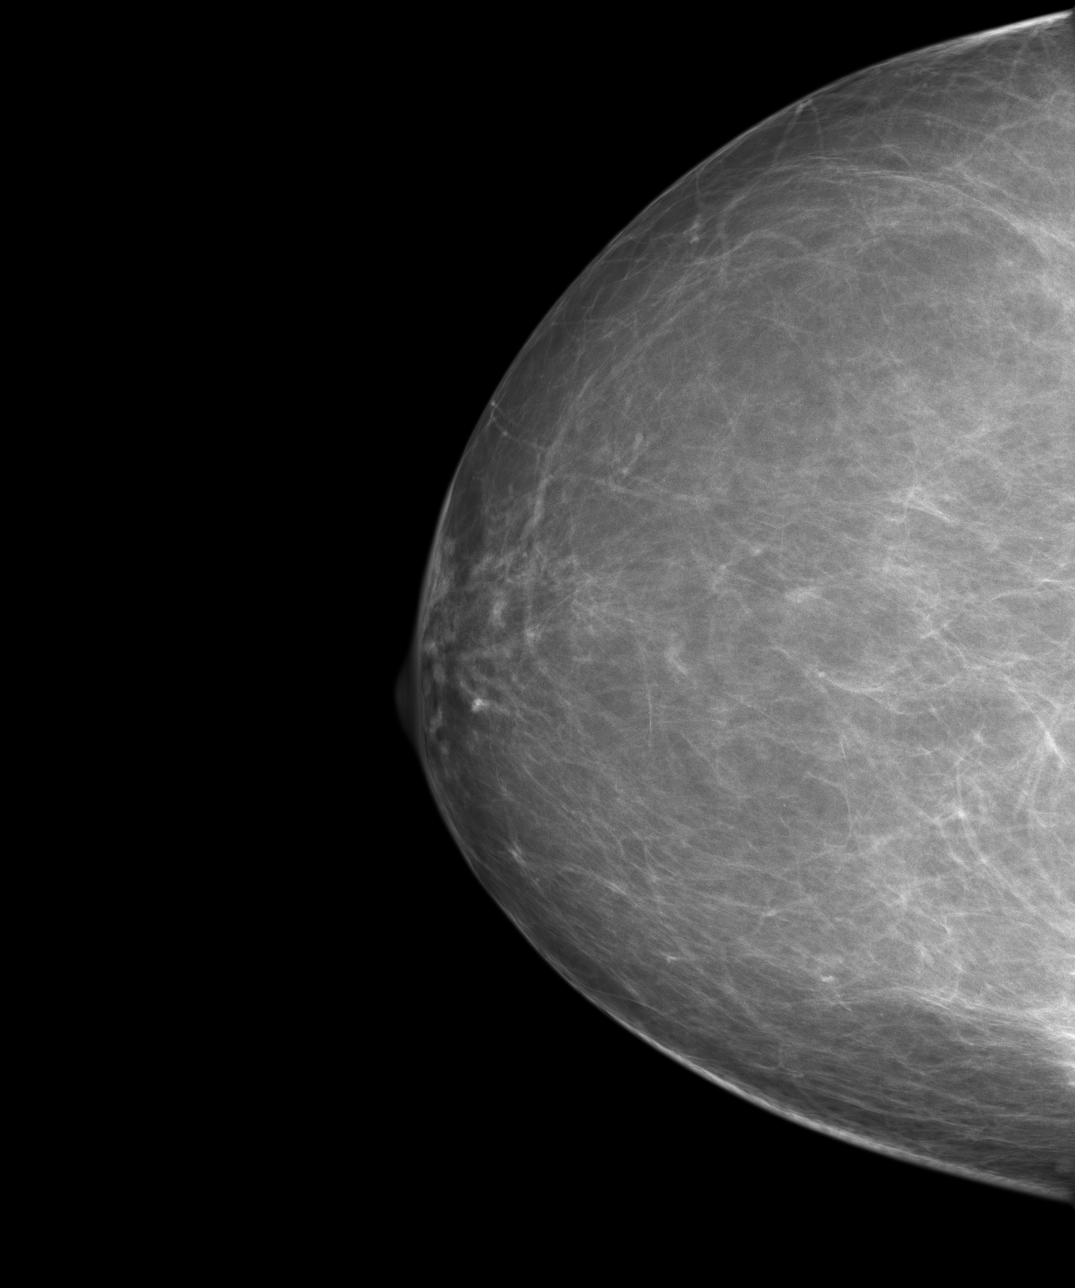
[im 2/4]
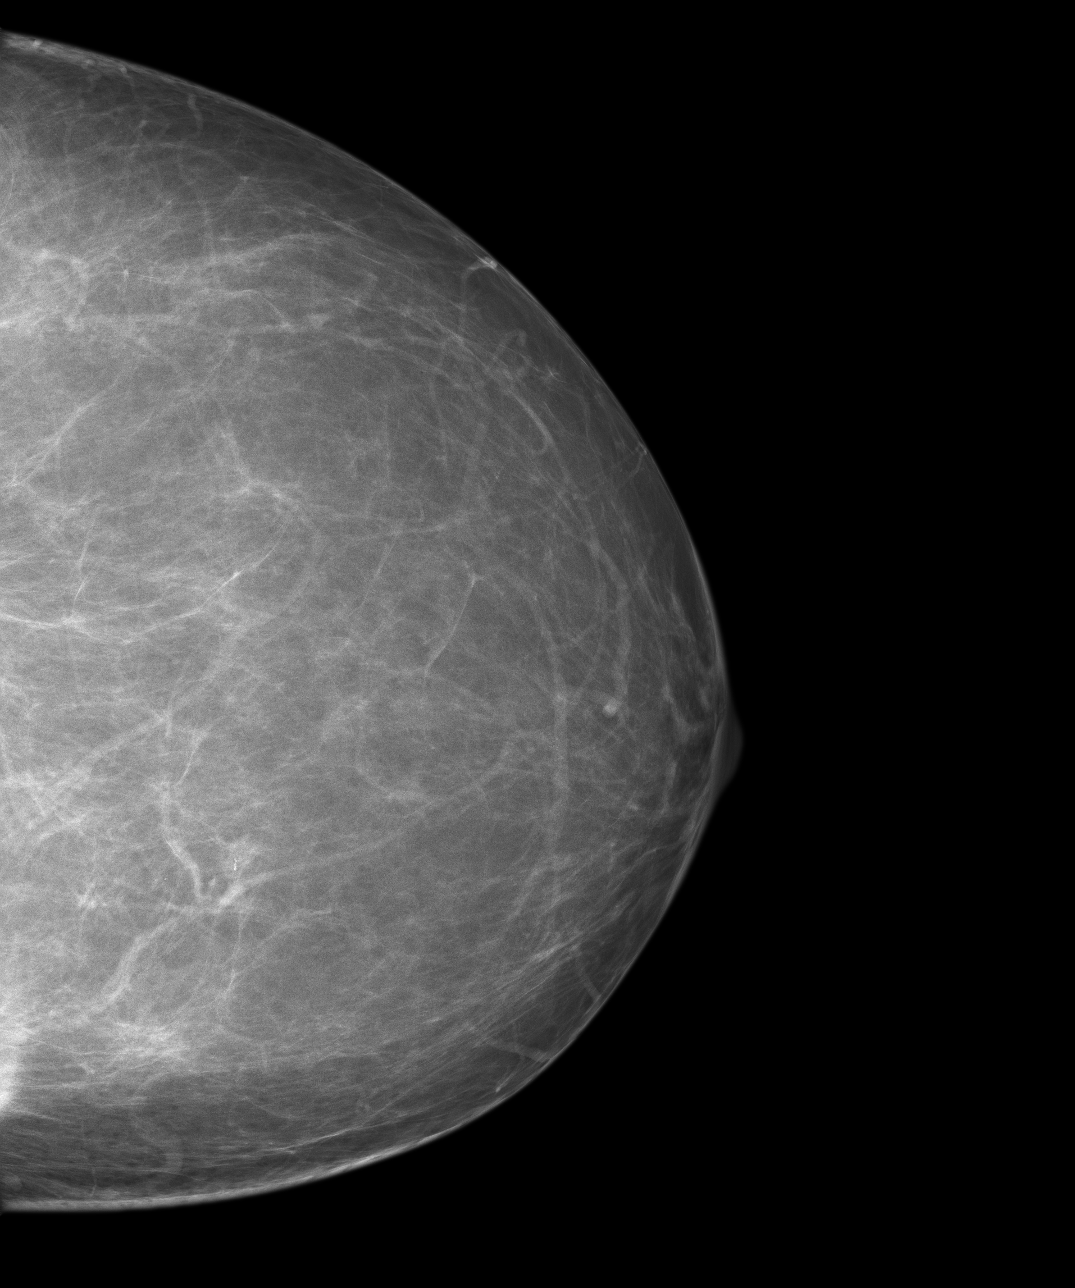
[im 3/4]
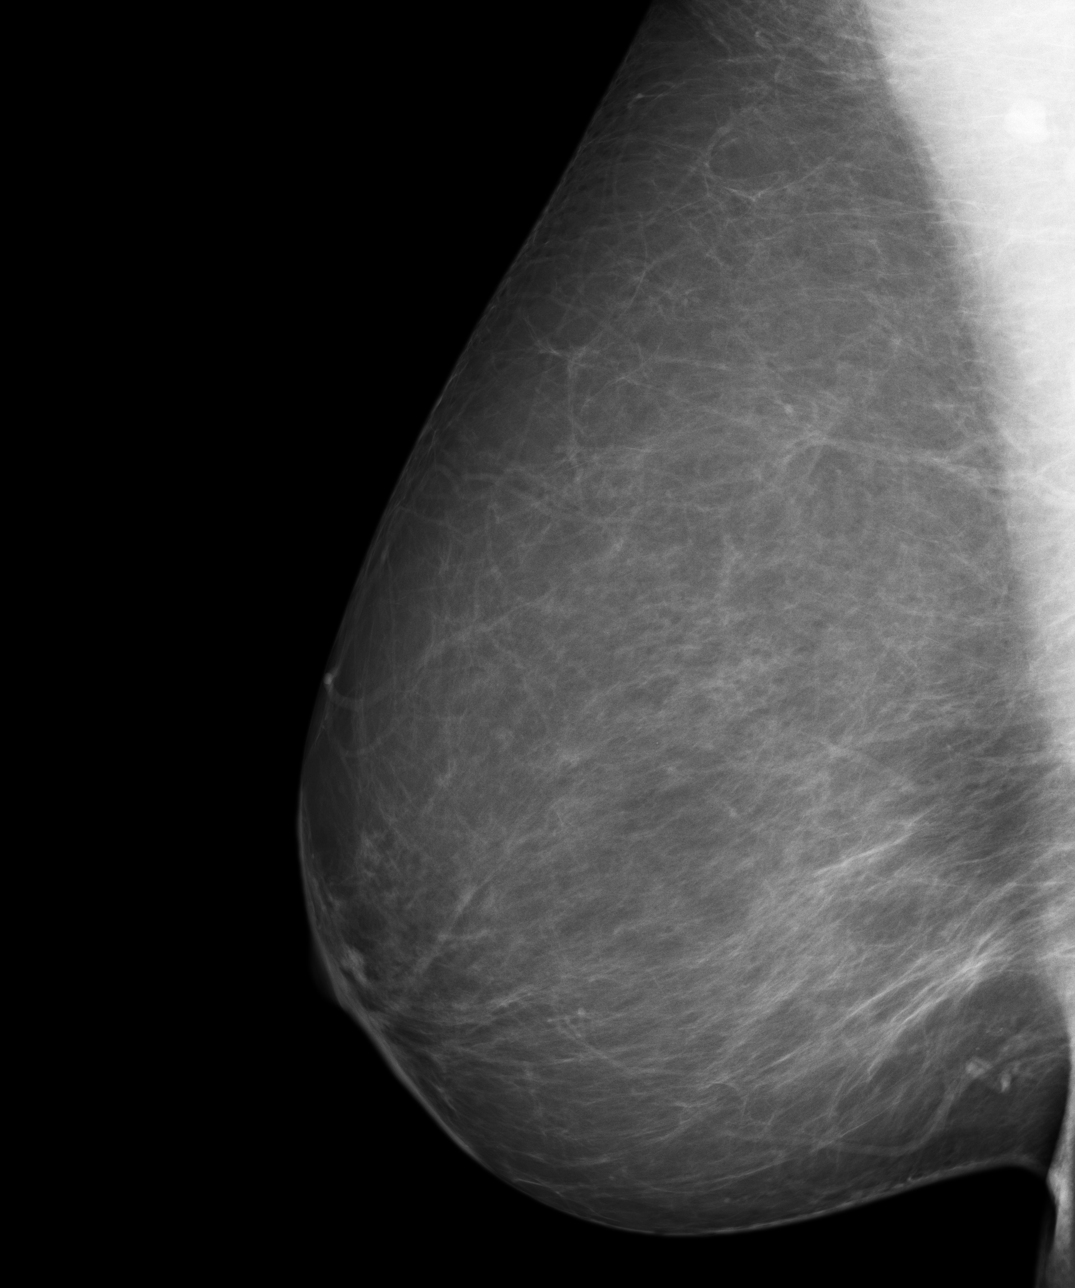
[im 4/4]
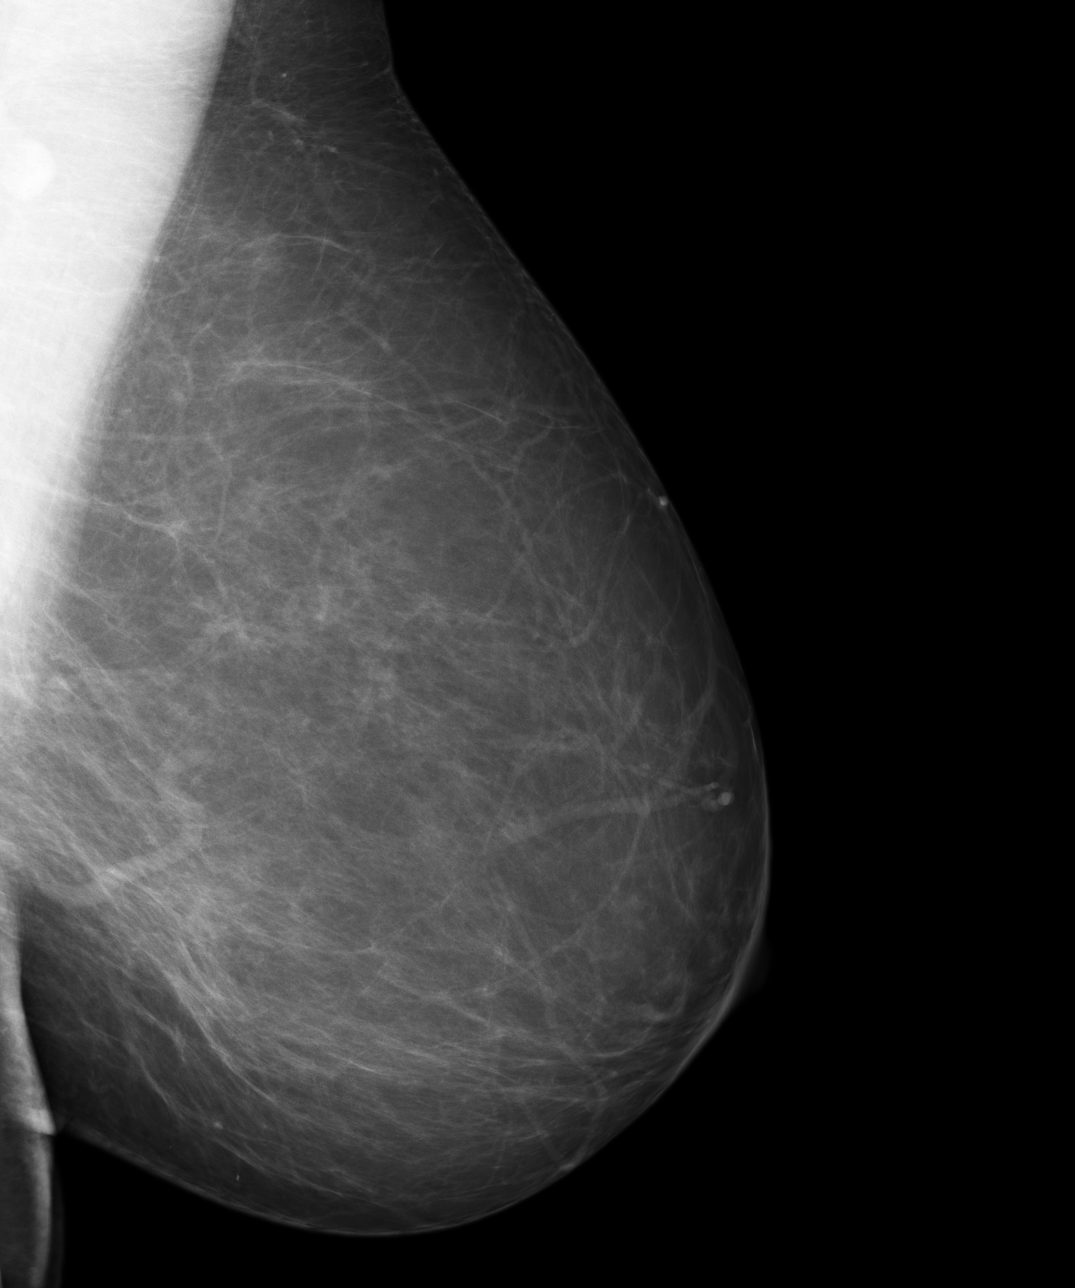

[4 of 4 positions shown; findings below may reference images not displayed]

FINDINGS: There is no known family history of breast cancer.

Comparison is made to previous digital images dated 12/08/2009 as well as
03/28/2007 and 12/03/2001.

The breasts exhibit a mildly dense heterogeneous parenchymal pattern without
a focal area of developing parenchymal density or dominant mass. No
malignant calcification or architectural distortion is present.
IMPRESSION: Stable, benign appearing bilateral mammogram.

BI-RADS:  Category 2 - Benign Finding

RECOMMENDATION:  Please continue to encourage annual mammographic follow-up.

Thank you for this opportunity to contribute to the care of your patient.

A NEGATIVE MAMMOGRAM REPORT DOES NOT PRECLUDE BIOPSY OR OTHER EVALUATION OF
A CLINICALLY PALPABLE OR OTHERWISE SUSPICIOUS MASS OR LESION. BREAST CANCER
MAY NOT BE DETECTED BY MAMMOGRAPHY IN UP TO 10% OF CASES.

## 2013-04-16 ENCOUNTER — Telehealth: Payer: Self-pay

## 2013-04-16 NOTE — Telephone Encounter (Signed)
Pt said her husband died 03/21/13 and pt just returned to work; pt has tried to return to work and pt cannot work due to being emotionally upset. Pt is a Solicitor at [employer]. Pt said she needs a note to remain out of work and still keep her job; Pt wants note to be written but does not want to come in for appt.pt request cb. Please advise.

## 2013-04-16 NOTE — Telephone Encounter (Signed)
I can write her a short term note but I do need to see her for an appt  if she is emotionally too upset to work - understand that she is going through grief and so sorry that she is going through this  Let her know - then return this note to me and I will write a note for the next 10 days  Please let me know what her first day of work missed was - and also if her employer has any type of bereavement time off guidelines  Thanks

## 2013-04-17 NOTE — Telephone Encounter (Signed)
Pt said she did speak with her employer and they gave her bereavement time so she just need a letter writing her out from the day her bereavement time ends, pt wants letter from 04/22/13 -(as long as you can do.Marland KitchenMarland Kitchen?10 days or more), please advise, pt didn't know if you would still need her to f/u with you or not but will see how she is emotionally in Dec

## 2013-04-19 NOTE — Telephone Encounter (Signed)
Done and in IN box 

## 2013-04-19 NOTE — Telephone Encounter (Signed)
Pt advise letter ready for pick-up

## 2013-05-31 ENCOUNTER — Ambulatory Visit: Payer: Federal, State, Local not specified - PPO | Admitting: Family Medicine

## 2013-09-23 ENCOUNTER — Telehealth: Payer: Self-pay | Admitting: Family Medicine

## 2013-09-23 DIAGNOSIS — Z0000 Encounter for general adult medical examination without abnormal findings: Secondary | ICD-10-CM

## 2013-09-23 NOTE — Telephone Encounter (Signed)
Message copied by Judy PimpleWER, MARNE A on Mon Sep 23, 2013  5:28 PM ------      Message from: Alvina ChouWALSH, TERRI J      Created: Thu Sep 12, 2013  2:42 PM      Regarding: Lab orders for Tuesday, 5.5.15       Patient is scheduled for CPX labs, please order future labs, Thanks , Terri       ------

## 2013-09-24 ENCOUNTER — Other Ambulatory Visit: Payer: Federal, State, Local not specified - PPO

## 2013-10-01 ENCOUNTER — Encounter: Payer: Federal, State, Local not specified - PPO | Admitting: Family Medicine

## 2013-10-30 ENCOUNTER — Other Ambulatory Visit: Payer: Self-pay | Admitting: Family Medicine

## 2014-02-04 ENCOUNTER — Ambulatory Visit (INDEPENDENT_AMBULATORY_CARE_PROVIDER_SITE_OTHER)
Admission: RE | Admit: 2014-02-04 | Discharge: 2014-02-04 | Disposition: A | Discharge status: Home / Self Care | Payer: Federal, State, Local not specified - PPO | Source: Ambulatory Visit | Attending: Family Medicine | Admitting: Family Medicine

## 2014-02-04 ENCOUNTER — Ambulatory Visit (INDEPENDENT_AMBULATORY_CARE_PROVIDER_SITE_OTHER): Payer: Federal, State, Local not specified - PPO | Admitting: Family Medicine

## 2014-02-04 ENCOUNTER — Encounter (INDEPENDENT_AMBULATORY_CARE_PROVIDER_SITE_OTHER): Payer: Self-pay

## 2014-02-04 ENCOUNTER — Encounter: Payer: Self-pay | Admitting: Family Medicine

## 2014-02-04 VITALS — BP 142/82 | HR 68 | Temp 98.1°F | Ht 63.0 in | Wt 172.5 lb

## 2014-02-04 DIAGNOSIS — M89319 Hypertrophy of bone, unspecified shoulder: Secondary | ICD-10-CM

## 2014-02-04 DIAGNOSIS — M948X9 Other specified disorders of cartilage, unspecified sites: Secondary | ICD-10-CM

## 2014-02-04 NOTE — Patient Instructions (Signed)
Chest xray today I expect it to be normal - but we will get back to you with results   Keep exercising

## 2014-02-04 NOTE — Progress Notes (Signed)
Subjective:    Patient ID: Teresa Stephens, female    DOB: [birth year], 58 y.o.   MRN: [id number]  HPI Here with a knot in neck/ (where clavicle meets sternum) Noticed it yesterday  Can feel it and ? See it some  No pain  Has been exercising -ab exercise and walking   No hx of scoliosis   Brought chol profile from insurance co from April - and HDL went up  That is good  bp was good too (they came to her home)    Patient Active Problem List   Diagnosis Date Noted  . Colon cancer screening 08/21/2012  . Encounter for routine gynecological examination 08/21/2012  . Other screening mammogram 04/12/2011  . Routine general medical examination at a health care facility 03/07/2011  . SCIATICA, LEFT 06/02/2010  . LUMBAR RADICULOPATHY, LEFT 06/02/2010  . NONSPEC ELEVATION OF LEVELS OF TRANSAMINASE/LDH 04/24/2007  . HYPERTENSION 01/01/2007  . ACID REFLUX DISEASE 01/01/2007   Past Medical History  Diagnosis Date  . HTN (hypertension)   . GERD (gastroesophageal reflux disease)   . Helicobacter pylori infection     past history  . ASCUS (atypical squamous cells of undetermined significance) on Pap smear 1997    ok on repeat (10/98)   No past surgical history on file. History  Substance Use Topics  . Smoking status: Never Smoker   . Smokeless tobacco: Not on file  . Alcohol Use: Yes     Comment: Occasional wine   Family History  Problem Relation Age of Onset  . Kidney disease Father   . Hypertension Father   . Diabetes Father   . Heart attack Mother   . Hypertension Mother   . Lung cancer      uncle   Allergies  Allergen Reactions  . Cephalexin     REACTION: GI  . Nizatidine     REACTION: nausea   Current Outpatient Prescriptions on File Prior to Visit  Medication Sig Dispense Refill  . hydrochlorothiazide (HYDRODIURIL) 25 MG tablet TAKE 1 TABLET (25 MG TOTAL) BY MOUTH DAILY.  90 tablet  2  . ibuprofen (ADVIL,MOTRIN) 200 MG tablet Take 200 mg by mouth every 6 (six)  hours as needed.         No current facility-administered medications on file prior to visit.    Review of Systems    Review of Systems  Constitutional: Negative for fever, appetite change, fatigue and unexpected weight change.  Eyes: Negative for pain and visual disturbance.  Respiratory: Negative for cough and shortness of breath.   Cardiovascular: Negative for cp or palpitations    Gastrointestinal: Negative for nausea, diarrhea and constipation.  Genitourinary: Negative for urgency and frequency.  Skin: Negative for pallor or rash   Neurological: Negative for weakness, light-headedness, numbness and headaches.  Hematological: Negative for adenopathy. Does not bruise/bleed easily.  Psychiatric/Behavioral: Negative for dysphoric mood. The patient is not nervous/anxious.      Objective:   Physical Exam  Constitutional: She appears well-developed and well-nourished. No distress.  obese and well appearing   HENT:  Head: Normocephalic and atraumatic.  Mouth/Throat: Oropharynx is clear and moist.  Eyes: Conjunctivae and EOM are normal. Pupils are equal, round, and reactive to light. No scleral icterus.  Neck: Normal range of motion. Neck supple. No JVD present. Carotid bruit is not present. No thyromegaly present.  Cardiovascular: Normal rate, regular rhythm, normal heart sounds and intact distal pulses.  Exam reveals no gallop.   Pulmonary/Chest: Effort  normal and breath sounds normal. No respiratory distress. She has no wheezes. She has no rales. She exhibits no tenderness.  Area of intersection of r clavicle and sternum is slt more prominent (forward) than the L side  Non tender No crepitus or skin change   Abdominal: Soft. Bowel sounds are normal. She exhibits no mass.  Lymphadenopathy:    She has no cervical adenopathy.  Neurological: She is alert. No cranial nerve deficit.  Skin: Skin is warm and dry. No rash noted.  Psychiatric: She has a normal mood and affect.           Assessment & Plan:   Problem List Items Addressed This Visit     Other   Clavicle enlargement - Primary     Pt feels like her clavicle (at jxn with sternum) is enl on the R  I do not appreciate much difference-likely due to back rotation Will do cxr to be sure  No symptoms     Relevant Orders      DG Chest 2 View (Completed)

## 2014-02-04 NOTE — Progress Notes (Signed)
Pre visit review using our clinic review tool, if applicable. No additional management support is needed unless otherwise documented below in the visit note. 

## 2014-02-04 NOTE — Assessment & Plan Note (Signed)
Pt feels like her clavicle (at jxn with sternum) is enl on the R  I do not appreciate much difference-likely due to back rotation Will do cxr to be sure  No symptoms

## 2014-02-24 ENCOUNTER — Other Ambulatory Visit: Payer: Federal, State, Local not specified - PPO

## 2014-03-03 ENCOUNTER — Encounter: Payer: Federal, State, Local not specified - PPO | Admitting: Family Medicine

## 2014-07-15 ENCOUNTER — Encounter: Payer: Self-pay | Admitting: Family Medicine

## 2014-07-15 ENCOUNTER — Ambulatory Visit (INDEPENDENT_AMBULATORY_CARE_PROVIDER_SITE_OTHER): Payer: Federal, State, Local not specified - PPO | Admitting: Family Medicine

## 2014-07-15 VITALS — BP 138/84 | HR 76 | Temp 97.9°F | Ht 63.0 in | Wt 170.4 lb

## 2014-07-15 DIAGNOSIS — Z0000 Encounter for general adult medical examination without abnormal findings: Secondary | ICD-10-CM

## 2014-07-15 DIAGNOSIS — Z1211 Encounter for screening for malignant neoplasm of colon: Secondary | ICD-10-CM

## 2014-07-15 DIAGNOSIS — I10 Essential (primary) hypertension: Secondary | ICD-10-CM

## 2014-07-15 NOTE — Assessment & Plan Note (Signed)
Reviewed health habits including diet and exercise and skin cancer prevention Reviewed appropriate screening tests for age  Also reviewed health mt list, fam hx and immunization status , as well as social and family history   Labs today  Declines flu shot and colonoscopy Given IFOB card She will make her own mammogram appt

## 2014-07-15 NOTE — Patient Instructions (Signed)
Labs today  Please do the stool card for colon cancer screening  Don't [facility] Take care of yourself    Get exercise daily

## 2014-07-15 NOTE — Progress Notes (Signed)
Pre visit review using our clinic review tool, if applicable. No additional management support is needed unless otherwise documented below in the visit note. 

## 2014-07-15 NOTE — Assessment & Plan Note (Signed)
Declines colonosc-disc pros and cons IFOB card given No symptoms

## 2014-07-15 NOTE — Assessment & Plan Note (Signed)
bp in fair control at this time  BP Readings from Last 1 Encounters:  07/15/14 138/84   No changes needed on hctz Disc lifstyle change with low sodium diet and exercise   Labs today

## 2014-07-15 NOTE — Progress Notes (Signed)
Subjective:    Patient ID: Shelby DubinShirley F Popov, female    DOB: [birth year], 59 y.o.   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 Clavicle enlargement 02/04/2014  . Colon cancer screening 08/21/2012  . Encounter for routine gynecological examination 08/21/2012  . Other screening mammogram 04/12/2011  . Routine general medical examination at a health care facility 03/07/2011  . SCIATICA, LEFT 06/02/2010  . LUMBAR RADICULOPATHY, LEFT 06/02/2010  . NONSPEC ELEVATION OF LEVELS OF TRANSAMINASE/LDH 04/24/2007  . Essential hypertension 01/01/2007  . ACID REFLUX DISEASE 01/01/2007   Past Medical History  Diagnosis Date  . HTN (hypertension)   . GERD (gastroesophageal reflux disease)   . Helicobacter pylori infection     past history  . ASCUS (atypical squamous cells of undetermined significance) on Pap smear 1997    ok on repeat (10/98)   No past surgical history on file. History  Substance Use Topics  . Smoking status: Never Smoker   . Smokeless tobacco: Not on file  . Alcohol Use: Yes     Comment: Occasional wine   Family History  Problem Relation Age of Onset  . Kidney disease Father   . Hypertension Father   . Diabetes Father   . Heart attack Mother   . Hypertension Mother   . Lung cancer      uncle    Allergies  Allergen Reactions  . Cephalexin     REACTION: GI  . Nizatidine     REACTION: nausea   Current Outpatient Prescriptions on File Prior to Visit  Medication Sig Dispense Refill  . hydrochlorothiazide (HYDRODIURIL) 25 MG tablet TAKE 1 TABLET (25 MG TOTAL) BY MOUTH DAILY. 90 tablet 2  . ibuprofen (ADVIL,MOTRIN) 200 MG tablet Take 200 mg by mouth every 6 (six) hours as needed.       No current facility-administered medications on file prior to visit.     Review of Systems    Review of Systems  Constitutional: Negative for fever, appetite change, fatigue and unexpected weight change.  Eyes: Negative for pain and visual disturbance.  Respiratory: Negative for cough and shortness of breath.   Cardiovascular: Negative for cp or palpitations    Gastrointestinal: Negative for nausea, diarrhea and constipation.  Genitourinary: Negative for urgency and frequency.  Skin: Negative for pallor or rash   Neurological: Negative for weakness, light-headedness, numbness and headaches.  Hematological: Negative for adenopathy. Does not bruise/bleed easily.  Psychiatric/Behavioral: Negative for dysphoric mood. The patient is not nervous/anxious.  pos for grief     Objective:   Physical Exam  Constitutional: She appears well-developed  and well-nourished. No distress.  obese and well appearing   HENT:  Head: Normocephalic and atraumatic.  Right Ear: External ear normal.  Left Ear: External ear normal.  Nose: Nose normal.  Mouth/Throat: Oropharynx is clear and moist.  Eyes: Conjunctivae and EOM are normal. Pupils are equal, round, and reactive to light. Right eye exhibits no discharge. Left eye exhibits no discharge. No scleral icterus.  Neck: Normal range of motion. Neck supple. No JVD present. No thyromegaly present.  Cardiovascular: Normal rate, regular rhythm, normal heart sounds and intact distal pulses.  Exam reveals no gallop.   Pulmonary/Chest: Effort normal and breath sounds  normal. No respiratory distress. She has no wheezes. She has no rales.  Abdominal: Soft. Bowel sounds are normal. She exhibits no distension and no mass. There is no tenderness.  Genitourinary:  Breast exam: No mass, nodules, thickening, tenderness, bulging, retraction, inflamation, nipple discharge or skin changes noted.  No axillary or clavicular LA.      Musculoskeletal: She exhibits no edema or tenderness.  Lymphadenopathy:    She has no cervical adenopathy.  Neurological: She is alert. She has normal reflexes. No cranial nerve deficit. She exhibits normal muscle tone. Coordination normal.  Skin: Skin is warm and dry. No rash noted. No erythema. No pallor.  Psychiatric: She has a normal mood and affect.          Assessment & Plan:   Problem List Items Addressed This Visit      Cardiovascular and Mediastinum   Essential hypertension    bp in fair control at this time  BP Readings from Last 1 Encounters:  07/15/14 138/84   No changes needed on hctz Disc lifstyle change with low sodium diet and exercise   Labs today          Other   Colon cancer screening    Declines colonosc-disc pros and cons IFOB card given No symptoms       Relevant Orders   Fecal occult blood, imunochemical   Routine general medical examination at a health care facility - Primary    Reviewed health habits including diet and exercise and skin cancer prevention Reviewed appropriate screening tests for age  Also reviewed health mt list, fam hx and immunization status , as well as social and family history   Labs today  Declines flu shot and colonoscopy Given IFOB card She will make her own mammogram appt

## 2014-07-16 ENCOUNTER — Telehealth: Payer: Self-pay | Admitting: Family Medicine

## 2014-07-16 LAB — COMPREHENSIVE METABOLIC PANEL
ALT: 8 U/L (ref 0–35)
AST: 18 U/L (ref 0–37)
Albumin: 4.3 g/dL (ref 3.5–5.2)
Alkaline Phosphatase: 77 U/L (ref 39–117)
BUN: 14 mg/dL (ref 6–23)
CO2: 34 mEq/L — ABNORMAL HIGH (ref 19–32)
Calcium: 9.8 mg/dL (ref 8.4–10.5)
Chloride: 100 mEq/L (ref 96–112)
Creatinine, Ser: 0.8 mg/dL (ref 0.40–1.20)
GFR: 94.83 mL/min (ref >60.00)
Glucose, Bld: 86 mg/dL (ref 70–99)
Potassium: 3.4 mEq/L — ABNORMAL LOW (ref 3.5–5.1)
Sodium: 140 mEq/L (ref 135–145)
Total Bilirubin: 1.4 mg/dL — ABNORMAL HIGH (ref 0.2–1.2)
Total Protein: 7.3 g/dL (ref 6.0–8.3)

## 2014-07-16 LAB — CBC WITH DIFFERENTIAL/PLATELET
Basophils Absolute: 0 10*3/uL (ref 0.0–0.1)
Basophils Relative: 0.5 % (ref 0.0–3.0)
Eosinophils Absolute: 0.1 10*3/uL (ref 0.0–0.7)
Eosinophils Relative: 1.2 % (ref 0.0–5.0)
HCT: 41.2 % (ref 36.0–46.0)
Hemoglobin: 14.1 g/dL (ref 12.0–15.0)
Lymphocytes Relative: 37 % (ref 12.0–46.0)
Lymphs Abs: 2 10*3/uL (ref 0.7–4.0)
MCHC: 34.1 g/dL (ref 30.0–36.0)
MCV: 90.6 fl (ref 78.0–100.0)
Monocytes Absolute: 0.5 10*3/uL (ref 0.1–1.0)
Monocytes Relative: 8.6 % (ref 3.0–12.0)
Neutro Abs: 2.8 10*3/uL (ref 1.4–7.7)
Neutrophils Relative %: 52.7 % (ref 43.0–77.0)
Platelets: 297 10*3/uL (ref 150.0–400.0)
RBC: 4.55 Mil/uL (ref 3.87–5.11)
RDW: 13.2 % (ref 11.5–15.5)
WBC: 5.4 10*3/uL (ref 4.0–10.5)

## 2014-07-16 LAB — LIPID PANEL
Cholesterol: 223 mg/dL — ABNORMAL HIGH (ref 0–200)
HDL: 61.1 mg/dL (ref >39.00)
LDL Cholesterol: 149 mg/dL — ABNORMAL HIGH (ref 0–99)
NonHDL: 161.9
Total CHOL/HDL Ratio: 4
Triglycerides: 66 mg/dL (ref 0.0–149.0)
VLDL: 13.2 mg/dL (ref 0.0–40.0)

## 2014-07-16 LAB — TSH: TSH: 1.19 u[IU]/mL (ref 0.35–4.50)

## 2014-07-16 NOTE — Telephone Encounter (Signed)
Emmi emailed  °

## 2014-07-25 ENCOUNTER — Other Ambulatory Visit (INDEPENDENT_AMBULATORY_CARE_PROVIDER_SITE_OTHER): Payer: Federal, State, Local not specified - PPO

## 2014-07-25 DIAGNOSIS — Z1211 Encounter for screening for malignant neoplasm of colon: Secondary | ICD-10-CM

## 2014-07-25 LAB — FECAL OCCULT BLOOD, IMMUNOCHEMICAL: Fecal Occult Bld: NEGATIVE

## 2014-09-25 ENCOUNTER — Other Ambulatory Visit: Payer: Self-pay | Admitting: Family Medicine

## 2014-10-27 ENCOUNTER — Telehealth: Payer: Self-pay | Admitting: *Deleted

## 2014-10-27 NOTE — Telephone Encounter (Signed)
Mammogram f/u call: called pt because there is no recent mammogram on chart (over [id number]), pt advise me she hasn't scheduled appt yet so I gave her [facility] phone #, pt said she will call and schedule appt

## 2014-12-16 ENCOUNTER — Other Ambulatory Visit: Payer: Self-pay | Admitting: Family Medicine

## 2014-12-16 ENCOUNTER — Telehealth: Payer: Self-pay

## 2014-12-16 DIAGNOSIS — Z1231 Encounter for screening mammogram for malignant neoplasm of breast: Secondary | ICD-10-CM

## 2014-12-16 NOTE — Telephone Encounter (Signed)
Left a voicemail for patient in regards to scheduling a Mammogram.  

## 2014-12-16 NOTE — Telephone Encounter (Signed)
Patient returned my call about scheduling a Mammogram. Patient would like to schedule an appointment with [facility] at [facility]. I provided them with the contact information, and patient stated that she would call and set up an appointment.

## 2014-12-19 ENCOUNTER — Ambulatory Visit
Admission: RE | Admit: 2014-12-19 | Discharge: 2014-12-19 | Disposition: A | Discharge status: Home / Self Care | Payer: Federal, State, Local not specified - PPO | Source: Ambulatory Visit | Attending: Family Medicine | Admitting: Family Medicine

## 2014-12-19 DIAGNOSIS — Z1231 Encounter for screening mammogram for malignant neoplasm of breast: Secondary | ICD-10-CM | POA: Insufficient documentation

## 2015-04-03 ENCOUNTER — Other Ambulatory Visit: Payer: Self-pay | Admitting: Family Medicine

## 2015-06-23 ENCOUNTER — Ambulatory Visit (INDEPENDENT_AMBULATORY_CARE_PROVIDER_SITE_OTHER): Payer: Federal, State, Local not specified - PPO | Admitting: Family Medicine

## 2015-06-23 ENCOUNTER — Encounter: Payer: Self-pay | Admitting: Family Medicine

## 2015-06-23 VITALS — BP 132/82 | HR 77 | Temp 98.3°F | Ht 63.0 in | Wt 169.0 lb

## 2015-06-23 DIAGNOSIS — N898 Other specified noninflammatory disorders of vagina: Secondary | ICD-10-CM | POA: Diagnosis not present

## 2015-06-23 NOTE — Progress Notes (Signed)
Pre visit review using our clinic review tool, if applicable. No additional management support is needed unless otherwise documented below in the visit note. 

## 2015-06-23 NOTE — Patient Instructions (Signed)
You have a few labial (vaginal) external cysts - sometimes these come from ingrown hairs  They do not look infected  If pain develops let me know/ or if they get a lot bigger  Use a warm compress ( hot wash rag) or sit in a warm bath - these will help them  Do not shave in the pelvic area Keep areas clean  Continue using condoms for STD prevention   Schedule a physical on the way out - early summer

## 2015-06-23 NOTE — Progress Notes (Signed)
Subjective:    Patient ID: Teresa Stephens, female    DOB: Jan 21, 1956, 60 y.o.   MRN: [id number]  HPI Here for some bumps on both sides of her vagina area (outside on the skin)-noticed about 10 days ago (they come and go)  Not painful  Do not drain - but one of them seems puffy   Is sexually active - does use condoms  Has a new partner  No vaginal discharge or pelvic pain   Partner denies STDs Pt has no hx of STDs   Patient Active Problem List   Diagnosis Date Noted  . Vaginal cyst 06/23/2015  . Clavicle enlargement 02/04/2014  . Colon cancer screening 08/21/2012  . Encounter for routine gynecological examination 08/21/2012  . Other screening mammogram 04/12/2011  . Routine general medical examination at a health care facility 03/07/2011  . SCIATICA, LEFT 06/02/2010  . LUMBAR RADICULOPATHY, LEFT 06/02/2010  . NONSPEC ELEVATION OF LEVELS OF TRANSAMINASE/LDH 04/24/2007  . Essential hypertension 01/01/2007  . ACID REFLUX DISEASE 01/01/2007   Past Medical History  Diagnosis Date  . HTN (hypertension)   . GERD (gastroesophageal reflux disease)   . Helicobacter pylori infection     past history  . ASCUS (atypical squamous cells of undetermined significance) on Pap smear 1997    ok on repeat (10/98)   No past surgical history on file. Social History  Substance Use Topics  . Smoking status: Never Smoker   . Smokeless tobacco: None  . Alcohol Use: 0.0 oz/week    0 Standard drinks or equivalent per week     Comment: Occasional wine   Family History  Problem Relation Age of Onset  . Kidney disease Father   . Hypertension Father   . Diabetes Father   . Heart attack Mother   . Hypertension Mother   . Lung cancer      uncle   Allergies  Allergen Reactions  . Cephalexin     REACTION: GI  . Nizatidine     REACTION: nausea   Current Outpatient Prescriptions on File Prior to Visit  Medication Sig Dispense Refill  . hydrochlorothiazide (HYDRODIURIL) 25 MG tablet TAKE  1 TABLET (25 MG TOTAL) BY MOUTH DAILY. 90 tablet 0  . ibuprofen (ADVIL,MOTRIN) 200 MG tablet Take 200 mg by mouth every 6 (six) hours as needed.       No current facility-administered medications on file prior to visit.    Review of Systems    Review of Systems  Constitutional: Negative for fever, appetite change, fatigue and unexpected weight change.  Eyes: Negative for pain and visual disturbance.  Respiratory: Negative for cough and shortness of breath.   Cardiovascular: Negative for cp or palpitations    Gastrointestinal: Negative for nausea, diarrhea and constipation.  Genitourinary: Negative for urgency and frequency. neg for vaginal d/c or pelvic pain  Skin: Negative for pallor or rash   Neurological: Negative for weakness, light-headedness, numbness and headaches.  Hematological: Negative for adenopathy. Does not bruise/bleed easily.  Psychiatric/Behavioral: Negative for dysphoric mood. The patient is not nervous/anxious.      Objective:   Physical Exam  Constitutional: She appears well-developed and well-nourished. No distress.  obese and well appearing   Neck: Normal range of motion. Neck supple.  Cardiovascular: Normal rate and regular rhythm.   Genitourinary:  Cysts of labia majora= less than 1 cm in diameter One R upper One L mid labia  No drainage or erythema  Mildly tender  No d/c or other  findings   Musculoskeletal: She exhibits no edema.  Lymphadenopathy:    She has no cervical adenopathy.  Neurological: She is alert.  Skin: Skin is warm and dry. No rash noted.  Psychiatric: She has a normal mood and affect.          Assessment & Plan:   Problem List Items Addressed This Visit      Genitourinary   Vaginal cyst - Primary    2 small cysts of labia majora- one R upper and one L mid  Mildly tender No redness or signs of infection  She has had these before  Recommend warm compress and sitz baths - enc drainage if necessary  F/u if worse or more  painful   F/u for pap/ annual exam Pt declines STD testing at this time

## 2015-06-24 NOTE — Assessment & Plan Note (Signed)
2 small cysts of labia majora- one R upper and one L mid  Mildly tender No redness or signs of infection  She has had these before  Recommend warm compress and sitz baths - enc drainage if necessary  F/u if worse or more painful   F/u for pap/ annual exam Pt declines STD testing at this time

## 2015-07-09 ENCOUNTER — Other Ambulatory Visit: Payer: Self-pay | Admitting: Family Medicine

## 2015-09-28 DIAGNOSIS — R300 Dysuria: Secondary | ICD-10-CM | POA: Diagnosis not present

## 2015-09-28 DIAGNOSIS — N3001 Acute cystitis with hematuria: Secondary | ICD-10-CM | POA: Diagnosis not present

## 2015-10-07 DIAGNOSIS — J988 Other specified respiratory disorders: Secondary | ICD-10-CM | POA: Diagnosis not present

## 2015-10-07 DIAGNOSIS — L309 Dermatitis, unspecified: Secondary | ICD-10-CM | POA: Diagnosis not present

## 2015-10-19 ENCOUNTER — Telehealth: Payer: Self-pay | Admitting: Family Medicine

## 2015-10-19 DIAGNOSIS — Z0000 Encounter for general adult medical examination without abnormal findings: Secondary | ICD-10-CM

## 2015-10-19 NOTE — Telephone Encounter (Signed)
-----   Message from Alvina Chouerri J Walsh sent at 10/15/2015 10:48 AM EDT ----- Regarding: Lab orders for Friday, 6.2.17 Patient is scheduled for CPX labs, please order future labs, Thanks , Camelia Engerri

## 2015-10-23 ENCOUNTER — Other Ambulatory Visit (INDEPENDENT_AMBULATORY_CARE_PROVIDER_SITE_OTHER): Payer: Federal, State, Local not specified - PPO

## 2015-10-23 DIAGNOSIS — Z0000 Encounter for general adult medical examination without abnormal findings: Secondary | ICD-10-CM

## 2015-10-23 LAB — LIPID PANEL
Cholesterol: 179 mg/dL (ref 0–200)
HDL: 50.8 mg/dL (ref >39.00)
LDL Cholesterol: 118 mg/dL — ABNORMAL HIGH (ref 0–99)
NonHDL: 128.14
Total CHOL/HDL Ratio: 4
Triglycerides: 49 mg/dL (ref 0.0–149.0)
VLDL: 9.8 mg/dL (ref 0.0–40.0)

## 2015-10-23 LAB — CBC WITH DIFFERENTIAL/PLATELET
Basophils Absolute: 0 10*3/uL (ref 0.0–0.1)
Basophils Relative: 0.8 % (ref 0.0–3.0)
Eosinophils Absolute: 0.1 10*3/uL (ref 0.0–0.7)
Eosinophils Relative: 2.5 % (ref 0.0–5.0)
HCT: 35 % — ABNORMAL LOW (ref 36.0–46.0)
Hemoglobin: 11.8 g/dL — ABNORMAL LOW (ref 12.0–15.0)
Lymphocytes Relative: 43.7 % (ref 12.0–46.0)
Lymphs Abs: 2 10*3/uL (ref 0.7–4.0)
MCHC: 33.7 g/dL (ref 30.0–36.0)
MCV: 90.4 fl (ref 78.0–100.0)
Monocytes Absolute: 0.3 10*3/uL (ref 0.1–1.0)
Monocytes Relative: 7.1 % (ref 3.0–12.0)
Neutro Abs: 2.1 10*3/uL (ref 1.4–7.7)
Neutrophils Relative %: 45.9 % (ref 43.0–77.0)
Platelets: 301 10*3/uL (ref 150.0–400.0)
RBC: 3.86 Mil/uL — ABNORMAL LOW (ref 3.87–5.11)
RDW: 13.9 % (ref 11.5–15.5)
WBC: 4.7 10*3/uL (ref 4.0–10.5)

## 2015-10-23 LAB — COMPREHENSIVE METABOLIC PANEL
ALT: 8 U/L (ref 0–35)
AST: 17 U/L (ref 0–37)
Albumin: 3.6 g/dL (ref 3.5–5.2)
Alkaline Phosphatase: 56 U/L (ref 39–117)
BUN: 9 mg/dL (ref 6–23)
CO2: 30 mEq/L (ref 19–32)
Calcium: 9.3 mg/dL (ref 8.4–10.5)
Chloride: 109 mEq/L (ref 96–112)
Creatinine, Ser: 0.77 mg/dL (ref 0.40–1.20)
GFR: 98.67 mL/min (ref >60.00)
Glucose, Bld: 90 mg/dL (ref 70–99)
Potassium: 3.8 mEq/L (ref 3.5–5.1)
Sodium: 144 mEq/L (ref 135–145)
Total Bilirubin: 0.8 mg/dL (ref 0.2–1.2)
Total Protein: 6.4 g/dL (ref 6.0–8.3)

## 2015-10-23 LAB — TSH: TSH: 2.77 u[IU]/mL (ref 0.35–4.50)

## 2015-10-26 ENCOUNTER — Ambulatory Visit (INDEPENDENT_AMBULATORY_CARE_PROVIDER_SITE_OTHER): Payer: Federal, State, Local not specified - PPO | Admitting: Family Medicine

## 2015-10-26 ENCOUNTER — Encounter: Payer: Self-pay | Admitting: Family Medicine

## 2015-10-26 VITALS — BP 135/80 | HR 64 | Temp 98.2°F | Ht 62.75 in | Wt 169.2 lb

## 2015-10-26 DIAGNOSIS — I10 Essential (primary) hypertension: Secondary | ICD-10-CM | POA: Diagnosis not present

## 2015-10-26 DIAGNOSIS — Z1211 Encounter for screening for malignant neoplasm of colon: Secondary | ICD-10-CM | POA: Diagnosis not present

## 2015-10-26 DIAGNOSIS — Z0000 Encounter for general adult medical examination without abnormal findings: Secondary | ICD-10-CM | POA: Diagnosis not present

## 2015-10-26 DIAGNOSIS — D649 Anemia, unspecified: Secondary | ICD-10-CM

## 2015-10-26 NOTE — Progress Notes (Signed)
Pre visit review using our clinic review tool, if applicable. No additional management support is needed unless otherwise documented below in the visit note. 

## 2015-10-26 NOTE — Progress Notes (Signed)
Subjective:    Patient ID: Teresa Stephens, female    DOB: 18-Aug-1955, 60 y.o.   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 and doing some walking on a track   Eating fairly healthy - has cut out shrimp and red meat to get her cholesterol down   Hep C screening = unsure if she had when she had it for ins PE/ no risk factors  Declines HIV  (but then had a test for ins and it was negative)   Declines flu shots  Mammogram 7/16 normal  Self breast exam- no lumps   Td 4/14   ifob done 3/16 negative  Declines colonoscopy- still declines it  Will do stool card Info given on cologuard to take home   Pap 4/14-negative  Has not seen a gyn  No new problems  Wants to come back later for a pap because she has to travel today and get going soon    bp is stable today  No cp or palpitations or headaches or edema  No side effects to medicines  BP Readings from Last 3 Encounters:  10/26/15 140/72  06/23/15 132/82  07/15/14 138/84    Is anxious today  Takes hctz -no problems  BP: 135/80 mmHg  Improved on 2nd check    Results for orders placed or performed in visit on 10/23/15  CBC with Differential/Platelet  Result Value Ref Range   WBC 4.7 4.0 - 10.5 K/uL   RBC 3.86 (L) 3.87 - 5.11 Mil/uL   Hemoglobin 11.8 (L) 12.0 - 15.0 g/dL   HCT 35.0 (L) 36.0 - 46.0 %   MCV 90.4 78.0 - 100.0 fl   MCHC 33.7 30.0 - 36.0 g/dL   RDW 13.9 11.5 - 15.5 %   Platelets 301.0 150.0 - 400.0 K/uL   Neutrophils Relative % 45.9 43.0 - 77.0 %   Lymphocytes Relative 43.7 12.0 - 46.0 %   Monocytes Relative 7.1 3.0 - 12.0 %   Eosinophils Relative 2.5 0.0 - 5.0 %   Basophils Relative 0.8 0.0 - 3.0 %   Neutro Abs 2.1 1.4 - 7.7 K/uL   Lymphs Abs  2.0 0.7 - 4.0 K/uL   Monocytes Absolute 0.3 0.1 - 1.0 K/uL   Eosinophils Absolute 0.1 0.0 - 0.7 K/uL   Basophils Absolute 0.0 0.0 - 0.1 K/uL  Comprehensive metabolic panel  Result Value Ref Range   Sodium 144 135 - 145 mEq/L   Potassium 3.8 3.5 - 5.1 mEq/L   Chloride 109 96 - 112 mEq/L   CO2 30 19 - 32 mEq/L   Glucose, Bld 90 70 - 99 mg/dL   BUN 9 6 - 23 mg/dL   Creatinine, Ser 0.77 0.40 - 1.20 mg/dL   Total Bilirubin 0.8 0.2 - 1.2 mg/dL   Alkaline Phosphatase 56 39 - 117 U/L   AST 17 0 - 37 U/L   ALT 8 0 - 35 U/L   Total Protein 6.4 6.0 - 8.3 g/dL   Albumin 3.6 3.5 - 5.2 g/dL   Calcium 9.3 8.4 - 10.5 mg/dL   GFR 98.67 >60.00 mL/min  Lipid panel  Result Value  Ref Range   Cholesterol 179 0 - 200 mg/dL   Triglycerides 49.0 0.0 - 149.0 mg/dL   HDL 50.80 >39.00 mg/dL   VLDL 9.8 0.0 - 40.0 mg/dL   LDL Cholesterol 118 (H) 0 - 99 mg/dL   Total CHOL/HDL Ratio 4    NonHDL 128.14   TSH  Result Value Ref Range   TSH 2.77 0.35 - 4.50 uIU/mL    Hb is down No hx of [place] trait  Hx of low iron when on periods or pregnant  Does not donate blood   Cholesterol Lab Results  Component Value Date   CHOL 179 10/23/2015   CHOL 223* 07/15/2014   CHOL 192 08/16/2012   Lab Results  Component Value Date   HDL 50.80 10/23/2015   HDL 61.10 07/15/2014   HDL 55.20 08/16/2012   Lab Results  Component Value Date   LDLCALC 118* 10/23/2015   LDLCALC 149* 07/15/2014   LDLCALC 126* 08/16/2012   Lab Results  Component Value Date   TRIG 49.0 10/23/2015   TRIG 66.0 07/15/2014   TRIG 53.0 08/16/2012   Lab Results  Component Value Date   CHOLHDL 4 10/23/2015   CHOLHDL 4 07/15/2014   CHOLHDL 3 08/16/2012   Lab Results  Component Value Date   LDLDIRECT 125.2 11/19/2009   Both good and bad cholesterol came down  Not exercising as much as last time   Patient Active Problem List   Diagnosis Date Noted  . Vaginal cyst 06/23/2015  . Clavicle enlargement 02/04/2014  . Colon cancer  screening 08/21/2012  . Encounter for routine gynecological examination 08/21/2012  . Other screening mammogram 04/12/2011  . Routine general medical examination at a health care facility 03/07/2011  . SCIATICA, LEFT 06/02/2010  . LUMBAR RADICULOPATHY, LEFT 06/02/2010  . NONSPEC ELEVATION OF LEVELS OF TRANSAMINASE/LDH 04/24/2007  . Essential hypertension 01/01/2007  . ACID REFLUX DISEASE 01/01/2007   Past Medical History  Diagnosis Date  . HTN (hypertension)   . GERD (gastroesophageal reflux disease)   . Helicobacter pylori infection     past history  . ASCUS (atypical squamous cells of undetermined significance) on Pap smear 1997    ok on repeat (10/98)   No past surgical history on file. Social History  Substance Use Topics  . Smoking status: Never Smoker   . Smokeless tobacco: None  . Alcohol Use: 0.0 oz/week    0 Standard drinks or equivalent per week     Comment: Occasional wine   Family History  Problem Relation Age of Onset  . Kidney disease Father   . Hypertension Father   . Diabetes Father   . Heart attack Mother   . Hypertension Mother   . Lung cancer      uncle   Allergies  Allergen Reactions  . Cephalexin     REACTION: GI  . Nizatidine     REACTION: nausea   Current Outpatient Prescriptions on File Prior to Visit  Medication Sig Dispense Refill  . hydrochlorothiazide (HYDRODIURIL) 25 MG tablet TAKE 1 TABLET (25 MG TOTAL) BY MOUTH DAILY. 90 tablet 1  . ibuprofen (ADVIL,MOTRIN) 200 MG tablet Take 200 mg by mouth every 6 (six) hours as needed.       No current facility-administered medications on file prior to visit.      Review of Systems Review of Systems  Constitutional: Negative for fever, appetite change, fatigue and unexpected weight change.  Eyes: Negative for pain and visual disturbance.  Respiratory: Negative for cough  and shortness of breath.   Cardiovascular: Negative for cp or palpitations    Gastrointestinal: Negative for nausea,  diarrhea and constipation.  Genitourinary: Negative for urgency and frequency.  Skin: Negative for pallor or rash   Neurological: Negative for weakness, light-headedness, numbness and headaches.  Hematological: Negative for adenopathy. Does not bruise/bleed easily.  Psychiatric/Behavioral: Negative for dysphoric mood. The patient is not nervous/anxious.         Objective:   Physical Exam  Constitutional: She appears well-developed and well-nourished. No distress.  obese and well appearing   HENT:  Head: Normocephalic and atraumatic.  Right Ear: External ear normal.  Left Ear: External ear normal.  Nose: Nose normal.  Mouth/Throat: Oropharynx is clear and moist.  Eyes: Conjunctivae and EOM are normal. Pupils are equal, round, and reactive to light. Right eye exhibits no discharge. Left eye exhibits no discharge. No scleral icterus.  Baseline strabismus   Neck: Normal range of motion. Neck supple. No JVD present. Carotid bruit is not present. No thyromegaly present.  Cardiovascular: Normal rate, regular rhythm, normal heart sounds and intact distal pulses.  Exam reveals no gallop.   Pulmonary/Chest: Effort normal and breath sounds normal. No respiratory distress. She has no wheezes. She has no rales.  Abdominal: Soft. Bowel sounds are normal. She exhibits no distension and no mass. There is no tenderness.  Genitourinary:  Breast exam def for next visit  Musculoskeletal: She exhibits no edema or tenderness.  Lymphadenopathy:    She has no cervical adenopathy.  Neurological: She is alert. She has normal reflexes. No cranial nerve deficit. She exhibits normal muscle tone. Coordination normal.  Skin: Skin is warm and dry. No rash noted. No erythema. No pallor.  Psychiatric: She has a normal mood and affect.          Assessment & Plan:   Problem List Items Addressed This Visit      Cardiovascular and Mediastinum   Essential hypertension - Primary    Some whitecoat component    Better on 2nd check bp in fair control at this time  BP Readings from Last 1 Encounters:  10/26/15 135/80   No changes needed Disc lifstyle change with low sodium diet and exercise  Labs reviewed         Other   Routine general medical examination at a health care facility    Reviewed health habits including diet and exercise and skin cancer prevention Reviewed appropriate screening tests for age  Also reviewed health mt list, fam hx and immunization status , as well as social and family history   See HPI Will return for gyn exam a different day (could not stay for it)- due for 3 y pap  Labs reviewed slt anemia- ? Cause/will watch this/pt declines colonoscopy (given ifob kit)   Don't forget to schedule your mammogram  Do the IFOB stool test for colon screen ( you have mild anemia that I do not have a cause for)- eat greens  If you want to do a colonoscopy please let me know Get back to regular walking to help your HDL cholesterol and also weight  Schedule a follow up appointment for gyn exam and pap       Colon cancer screening    Pt declines colonoscopy -urged to change her mind given mild anemia Given ifob kit Will disc further at f/u      Anemia    Very mild - but with trend of dec Hb with time Declines colonoscopy No  hx of [place] trait No symptoms  No vaginal bleeding Given ifob kit Will monitor

## 2015-10-26 NOTE — Assessment & Plan Note (Signed)
Pt declines colonoscopy -urged to change her mind given mild anemia Given ifob kit Will disc further at f/u

## 2015-10-26 NOTE — Patient Instructions (Signed)
Don't forget to schedule your mammogram  Do the IFOB stool test for colon screen ( you have mild anemia that I do not have a cause for)- eat greens  If you want to do a colonoscopy please let me know Get back to regular walking to help your HDL cholesterol and also weight  Schedule a follow up appointment for gyn exam and pap

## 2015-10-26 NOTE — Assessment & Plan Note (Signed)
Reviewed health habits including diet and exercise and skin cancer prevention Reviewed appropriate screening tests for age  Also reviewed health mt list, fam hx and immunization status , as well as social and family history   See HPI Will return for gyn exam a different day (could not stay for it)- due for 3 y pap  Labs reviewed slt anemia- ? Cause/will watch this/pt declines colonoscopy (given ifob kit)   Don't forget to schedule your mammogram  Do the IFOB stool test for colon screen ( you have mild anemia that I do not have a cause for)- eat greens  If you want to do a colonoscopy please let me know Get back to regular walking to help your HDL cholesterol and also weight  Schedule a follow up appointment for gyn exam and pap

## 2015-10-26 NOTE — Assessment & Plan Note (Signed)
Very mild - but with trend of dec Hb with time Declines colonoscopy No hx of [place] trait No symptoms  No vaginal bleeding Given ifob kit Will monitor

## 2015-10-26 NOTE — Assessment & Plan Note (Signed)
Some whitecoat component  Better on 2nd check bp in fair control at this time  BP Readings from Last 1 Encounters:  10/26/15 135/80   No changes needed Disc lifstyle change with low sodium diet and exercise  Labs reviewed

## 2015-12-04 ENCOUNTER — Ambulatory Visit: Payer: Federal, State, Local not specified - PPO | Admitting: Family Medicine

## 2016-02-23 ENCOUNTER — Other Ambulatory Visit: Payer: Self-pay | Admitting: Family Medicine

## 2016-05-05 DIAGNOSIS — M7542 Impingement syndrome of left shoulder: Secondary | ICD-10-CM | POA: Diagnosis not present

## 2016-09-01 ENCOUNTER — Other Ambulatory Visit: Payer: Self-pay | Admitting: Family Medicine

## 2016-09-01 NOTE — Telephone Encounter (Signed)
No recent/future appts., please advise  

## 2016-09-01 NOTE — Telephone Encounter (Signed)
appts scheduled with pt.

## 2016-09-01 NOTE — Telephone Encounter (Signed)
Please schedule PE in the fall and refill until then  If unable to find a spot for PE -then f/u this summer

## 2016-11-17 ENCOUNTER — Telehealth: Payer: Self-pay | Admitting: Family Medicine

## 2016-11-17 DIAGNOSIS — Z0000 Encounter for general adult medical examination without abnormal findings: Secondary | ICD-10-CM

## 2016-11-17 DIAGNOSIS — D649 Anemia, unspecified: Secondary | ICD-10-CM

## 2016-11-17 NOTE — Telephone Encounter (Signed)
-----   Message from Alvina Chouerri J Walsh sent at 11/16/2016  4:07 PM EDT ----- Regarding: Lab orders for Friday, 7.6.18 Patient is scheduled for CPX labs, please order future labs, Thanks , Camelia Engerri

## 2016-11-25 ENCOUNTER — Other Ambulatory Visit (INDEPENDENT_AMBULATORY_CARE_PROVIDER_SITE_OTHER): Payer: Federal, State, Local not specified - PPO

## 2016-11-25 DIAGNOSIS — D649 Anemia, unspecified: Secondary | ICD-10-CM | POA: Diagnosis not present

## 2016-11-25 DIAGNOSIS — Z0000 Encounter for general adult medical examination without abnormal findings: Secondary | ICD-10-CM

## 2016-11-25 LAB — CBC WITH DIFFERENTIAL/PLATELET
Basophils Absolute: 0 10*3/uL (ref 0.0–0.1)
Basophils Relative: 0.8 % (ref 0.0–3.0)
Eosinophils Absolute: 0.1 10*3/uL (ref 0.0–0.7)
Eosinophils Relative: 2.5 % (ref 0.0–5.0)
HCT: 38.3 % (ref 36.0–46.0)
Hemoglobin: 13.2 g/dL (ref 12.0–15.0)
Lymphocytes Relative: 42.2 % (ref 12.0–46.0)
Lymphs Abs: 2.1 10*3/uL (ref 0.7–4.0)
MCHC: 34.5 g/dL (ref 30.0–36.0)
MCV: 91.1 fl (ref 78.0–100.0)
Monocytes Absolute: 0.4 10*3/uL (ref 0.1–1.0)
Monocytes Relative: 8.7 % (ref 3.0–12.0)
Neutro Abs: 2.3 10*3/uL (ref 1.4–7.7)
Neutrophils Relative %: 45.8 % (ref 43.0–77.0)
Platelets: 263 10*3/uL (ref 150.0–400.0)
RBC: 4.2 Mil/uL (ref 3.87–5.11)
RDW: 13.4 % (ref 11.5–15.5)
WBC: 5 10*3/uL (ref 4.0–10.5)

## 2016-11-25 LAB — COMPREHENSIVE METABOLIC PANEL
ALT: 7 U/L (ref 0–35)
AST: 15 U/L (ref 0–37)
Albumin: 4 g/dL (ref 3.5–5.2)
Alkaline Phosphatase: 67 U/L (ref 39–117)
BUN: 12 mg/dL (ref 6–23)
CO2: 32 mEq/L (ref 19–32)
Calcium: 9.6 mg/dL (ref 8.4–10.5)
Chloride: 105 mEq/L (ref 96–112)
Creatinine, Ser: 0.89 mg/dL (ref 0.40–1.20)
GFR: 83.17 mL/min (ref >60.00)
Glucose, Bld: 93 mg/dL (ref 70–99)
Potassium: 3.9 mEq/L (ref 3.5–5.1)
Sodium: 142 mEq/L (ref 135–145)
Total Bilirubin: 1.2 mg/dL (ref 0.2–1.2)
Total Protein: 6.8 g/dL (ref 6.0–8.3)

## 2016-11-25 LAB — TSH: TSH: 4.49 u[IU]/mL (ref 0.35–4.50)

## 2016-11-25 LAB — LIPID PANEL
Cholesterol: 206 mg/dL — ABNORMAL HIGH (ref 0–200)
HDL: 55 mg/dL (ref >39.00)
LDL Cholesterol: 138 mg/dL — ABNORMAL HIGH (ref 0–99)
NonHDL: 150.5
Total CHOL/HDL Ratio: 4
Triglycerides: 61 mg/dL (ref 0.0–149.0)
VLDL: 12.2 mg/dL (ref 0.0–40.0)

## 2016-11-25 LAB — FERRITIN: Ferritin: 54.3 ng/mL (ref 10.0–291.0)

## 2016-11-29 ENCOUNTER — Ambulatory Visit (INDEPENDENT_AMBULATORY_CARE_PROVIDER_SITE_OTHER): Payer: Federal, State, Local not specified - PPO | Admitting: Family Medicine

## 2016-11-29 ENCOUNTER — Other Ambulatory Visit (HOSPITAL_COMMUNITY)
Admission: RE | Admit: 2016-11-29 | Discharge: 2016-11-29 | Disposition: A | Discharge status: Home / Self Care | Payer: Federal, State, Local not specified - PPO | Source: Ambulatory Visit | Attending: Family Medicine | Admitting: Family Medicine

## 2016-11-29 ENCOUNTER — Encounter: Payer: Self-pay | Admitting: Family Medicine

## 2016-11-29 VITALS — BP 135/80 | HR 66 | Temp 98.4°F | Ht 63.0 in | Wt 174.2 lb

## 2016-11-29 DIAGNOSIS — Z0000 Encounter for general adult medical examination without abnormal findings: Secondary | ICD-10-CM | POA: Diagnosis not present

## 2016-11-29 DIAGNOSIS — E7800 Pure hypercholesterolemia, unspecified: Secondary | ICD-10-CM | POA: Diagnosis not present

## 2016-11-29 DIAGNOSIS — Z1231 Encounter for screening mammogram for malignant neoplasm of breast: Secondary | ICD-10-CM | POA: Diagnosis not present

## 2016-11-29 DIAGNOSIS — Z01419 Encounter for gynecological examination (general) (routine) without abnormal findings: Secondary | ICD-10-CM | POA: Insufficient documentation

## 2016-11-29 DIAGNOSIS — I10 Essential (primary) hypertension: Secondary | ICD-10-CM | POA: Diagnosis not present

## 2016-11-29 DIAGNOSIS — Z1211 Encounter for screening for malignant neoplasm of colon: Secondary | ICD-10-CM

## 2016-11-29 DIAGNOSIS — E785 Hyperlipidemia, unspecified: Secondary | ICD-10-CM | POA: Insufficient documentation

## 2016-11-29 MED ORDER — HYDROCHLOROTHIAZIDE 25 MG PO TABS: ORAL_TABLET | ORAL | 3 refills | Status: DC

## 2016-11-29 NOTE — Assessment & Plan Note (Signed)
Scheduled annual screening mammogram Nl breast exam today  Encouraged monthly self exams   

## 2016-11-29 NOTE — Progress Notes (Signed)
   Subjective:    Patient ID: Teresa DubinShirley F Fonte, female    DOB: Dec 14, 1955, 61 y.o.   MRN: [id number]  HPI Here for health maintenance exam and to review chronic medical problems   Doing well for her age overall    Wt Readings from Last 3 Encounters:  11/29/16 174 lb 4 oz (79 kg)  10/26/15 169 lb 4 oz (76.8 kg)  06/23/15 169 lb (76.7 kg)  wt is up 5 lb  Diet- is pretty healthy overall /most of the time avoids high calorie foods  Walking - at least 3 times per week  2 miles and some yard work   bmi 30.8  Pap nl 2014 Declined it last year Will do this year  No gyn symptoms or bleeding    Mammogram 7/16 nl - did not do last year  Self breast exam -no lumps    Declines colonoscopy Neg ifob 3/16 Given info about cologuard - ? Won't cover Will do ifob    Tetanus shot 4/14  bp is up today - she did not take her medicine today yet  No cp or palpitations or headaches or edema  No side effects to medicines  BP Readings from Last 3 Encounters:  11/29/16 (!) 144/80  10/26/15 135/80  06/23/15 132/82       Chemistry      Component Value Date/Time   NA 142 11/25/2016 0924   K 3.9 11/25/2016 0924   CL 105 11/25/2016 0924   CO2 32 11/25/2016 0924   BUN 12 11/25/2016 0924   CREATININE 0.89 11/25/2016 0924      Component Value Date/Time   CALCIUM 9.6 11/25/2016 0924   ALKPHOS 67 11/25/2016 0924   AST 15 11/25/2016 0924   ALT 7 11/25/2016 0924   BILITOT 1.2 11/25/2016 0924      Lab Results  Component Value Date   WBC 5.0 11/25/2016   HGB 13.2 11/25/2016   HCT 38.3 11/25/2016   MCV 91.1 11/25/2016   PLT 263.0 11/25/2016   Lab Results  Component Value Date   TSH 4.49 11/25/2016   Glucose is 93 -normal  Ferritin nl at 54  (anemic last year)   Cholesterol Lab Results  Component Value Date   CHOL 206 (H) 11/25/2016   CHOL 179 10/23/2015   CHOL 223 (H) 07/15/2014   Lab Results  Component Value Date   HDL 55.00 11/25/2016   HDL 50.80 10/23/2015   HDL 61.10  07/15/2014   Lab Results  Component Value Date   LDLCALC 138 (H) 11/25/2016   LDLCALC 118 (H) 10/23/2015   LDLCALC 149 (H) 07/15/2014   Lab Results  Component Value Date   TRIG 61.0 11/25/2016   TRIG 49.0 10/23/2015   TRIG 66.0 07/15/2014   Lab Results  Component Value Date   CHOLHDL 4 11/25/2016   CHOLHDL 4 10/23/2015   CHOLHDL 4 07/15/2014   Lab Results  Component Value Date   LDLDIRECT 125.2 11/19/2009   She is eating better but LDL is still up  She thinks she can do better with diet  She eats fried fish quite often  In retrospect she eats too much high fat food      Review of Systems     Objective:   Physical Exam        Assessment & Plan:

## 2016-11-29 NOTE — Assessment & Plan Note (Signed)
bp in fair control at this time  BP Readings from Last 1 Encounters:  11/29/16 135/80   No changes needed Disc lifstyle change with low sodium diet and exercise  Labs rev  F/u 1 mo to re check when she has taken medication

## 2016-11-29 NOTE — Assessment & Plan Note (Signed)
Pt declines colonoscopy or cologuard ifob kit given

## 2016-11-29 NOTE — Assessment & Plan Note (Signed)
Routine exam with pap No abn

## 2016-11-29 NOTE — Assessment & Plan Note (Signed)
Reviewed health habits including diet and exercise and skin cancer prevention Reviewed appropriate screening tests for age  Also reviewed health mt list, fam hx and immunization status , as well as social and family history   See HPI Labs rev Increase your exercise to 5 days per week   Stop at check out to get your mammogram appt  Please do stool card for colon screening   Avoid red meat/ fried foods/ egg yolks/ fatty breakfast meats/ butter, cheese and high fat dairy/ and shellfish   Your cholesterol is too high- it may be genetic   Follow up in 1 month with labs prior and see if cholesterol is improved

## 2016-11-29 NOTE — Assessment & Plan Note (Signed)
Disc goals for lipids and reasons to control them Rev labs with pt Rev low sat fat diet in detail Pt declines statin and wants to work on diet  Literature given Re check 1 mo

## 2016-11-29 NOTE — Patient Instructions (Addendum)
Increase your exercise to 5 days per week   Stop at check out to get your mammogram appt  Please do stool card for colon screening   Avoid red meat/ fried foods/ egg yolks/ fatty breakfast meats/ butter, cheese and high fat dairy/ and shellfish   Your cholesterol is too high- it may be genetic   Follow up in 1 month with labs prior and see if cholesterol is improved

## 2016-12-02 LAB — CYTOLOGY - PAP
Diagnosis: NEGATIVE
HPV: NOT DETECTED

## 2016-12-13 ENCOUNTER — Telehealth: Payer: Self-pay | Admitting: *Deleted

## 2016-12-13 ENCOUNTER — Other Ambulatory Visit (INDEPENDENT_AMBULATORY_CARE_PROVIDER_SITE_OTHER): Payer: Federal, State, Local not specified - PPO

## 2016-12-13 DIAGNOSIS — Z1211 Encounter for screening for malignant neoplasm of colon: Secondary | ICD-10-CM

## 2016-12-13 LAB — FECAL OCCULT BLOOD, IMMUNOCHEMICAL: Fecal Occult Bld: NEGATIVE

## 2016-12-13 NOTE — Telephone Encounter (Signed)
[facility] called pt dropped off Ifob with no order, order entered

## 2016-12-18 ENCOUNTER — Other Ambulatory Visit: Payer: Self-pay | Admitting: Family Medicine

## 2016-12-23 ENCOUNTER — Other Ambulatory Visit: Payer: Self-pay | Admitting: Family Medicine

## 2017-01-16 ENCOUNTER — Other Ambulatory Visit: Payer: Federal, State, Local not specified - PPO

## 2017-01-18 ENCOUNTER — Ambulatory Visit: Payer: Federal, State, Local not specified - PPO | Admitting: Family Medicine

## 2017-04-18 ENCOUNTER — Ambulatory Visit
Admission: RE | Admit: 2017-04-18 | Discharge: 2017-04-18 | Disposition: A | Discharge status: Home / Self Care | Payer: Federal, State, Local not specified - PPO | Source: Ambulatory Visit | Attending: Family Medicine | Admitting: Family Medicine

## 2017-04-18 DIAGNOSIS — Z1231 Encounter for screening mammogram for malignant neoplasm of breast: Secondary | ICD-10-CM

## 2017-06-30 DIAGNOSIS — J069 Acute upper respiratory infection, unspecified: Secondary | ICD-10-CM | POA: Diagnosis not present

## 2017-07-06 DIAGNOSIS — J209 Acute bronchitis, unspecified: Secondary | ICD-10-CM | POA: Diagnosis not present

## 2017-12-16 IMAGING — MG MM DIGITAL SCREENING BILAT W/ CAD
4 series · 4 of 4 positions shown · non-contrast
Comparison: Previous exam(s).

ACR Breast Density Category a: The breast tissue is almost entirely
fatty.

CLINICAL DATA: Screening.

EXAM:
DIGITAL SCREENING BILATERAL MAMMOGRAM WITH CAD

[R CC]
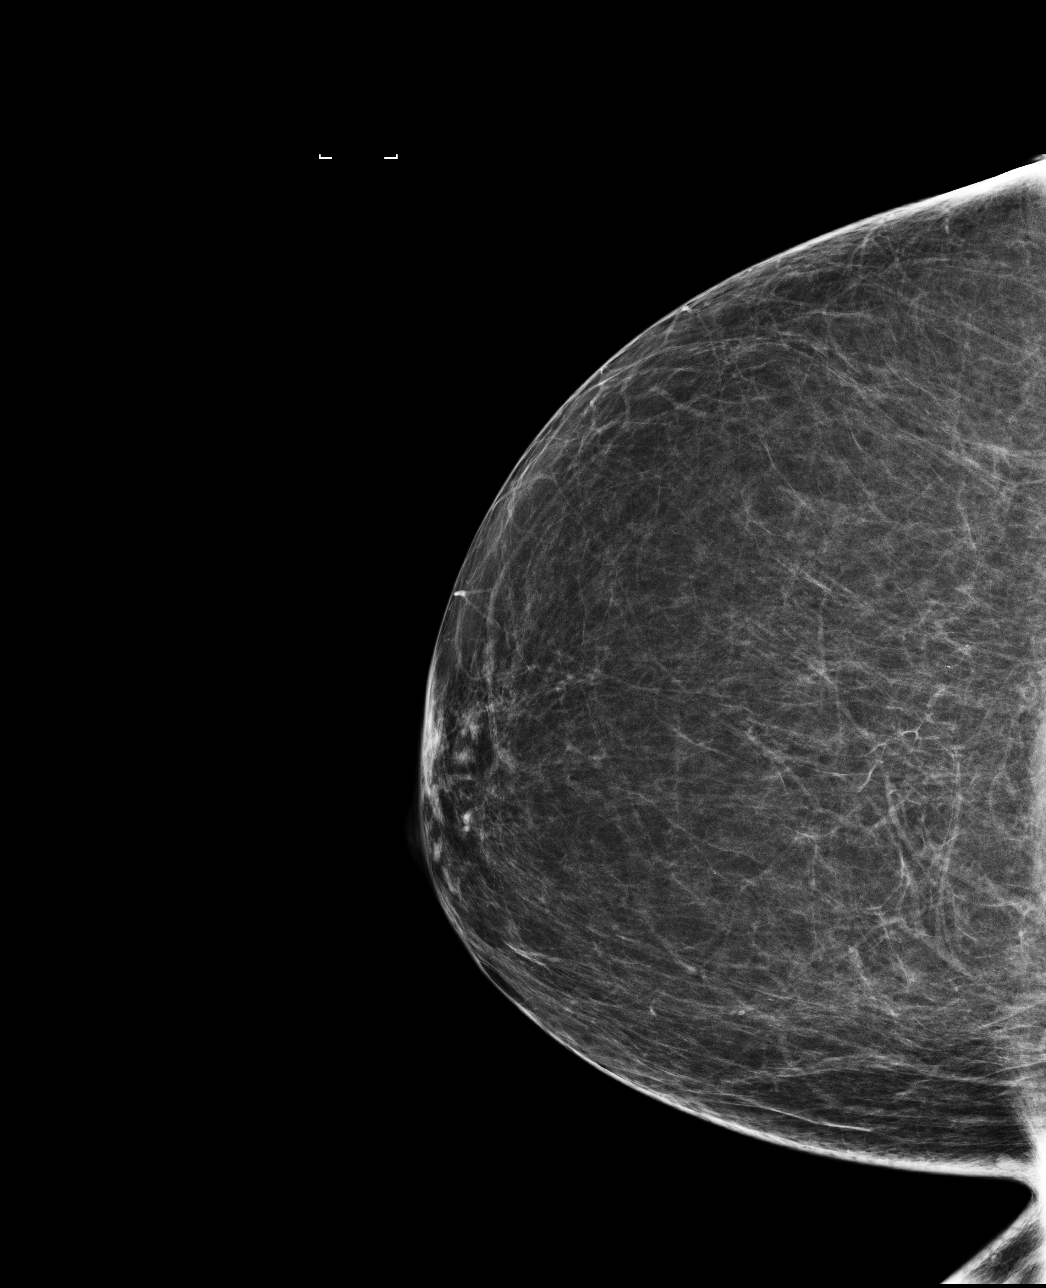

[R MLO]
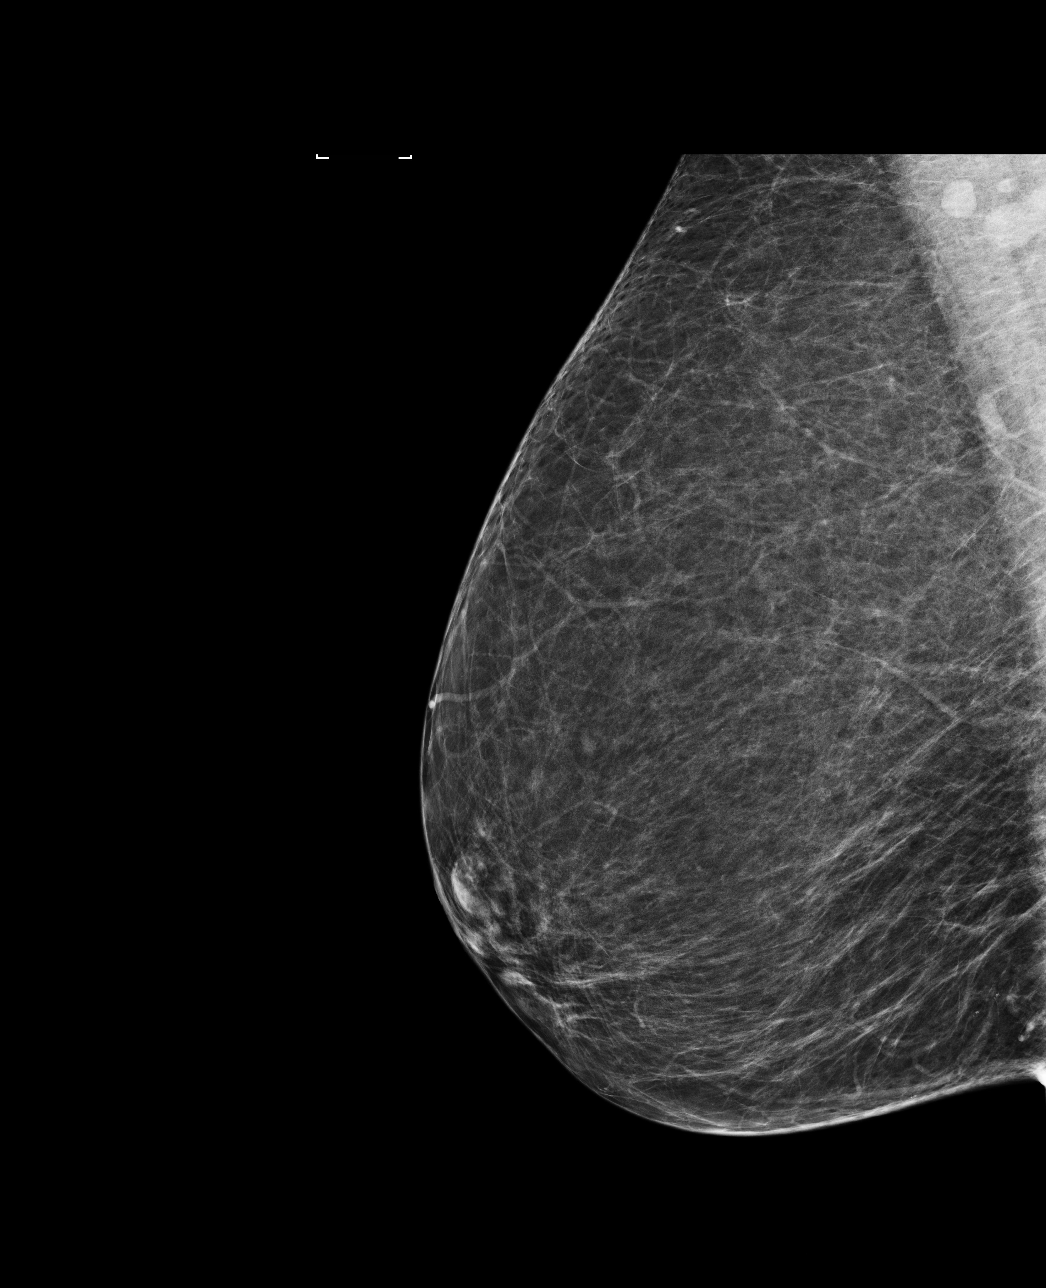

[L MLO]
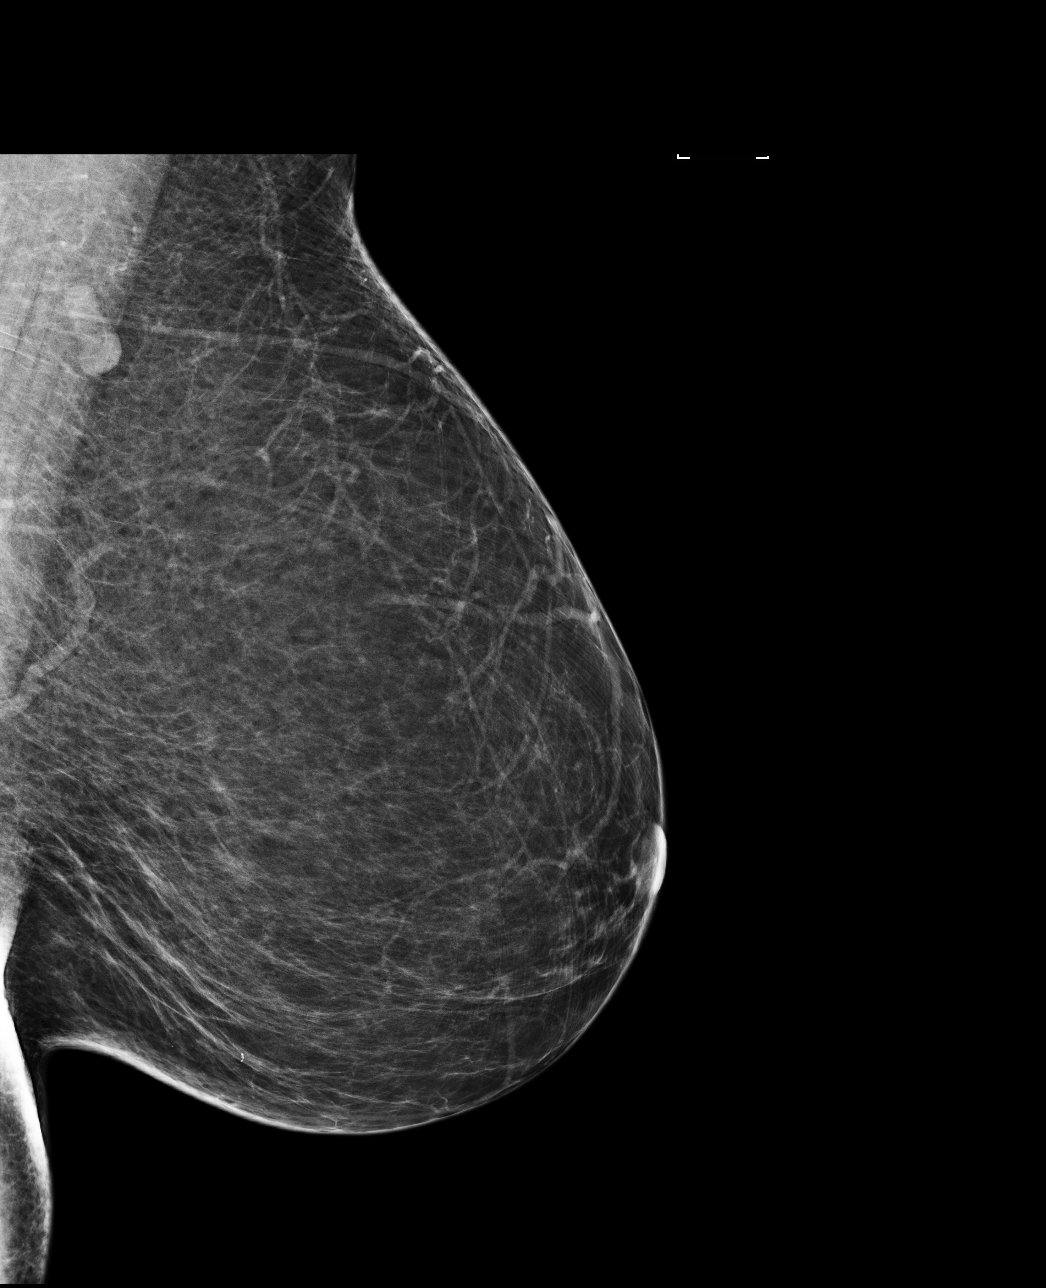

[L CC]
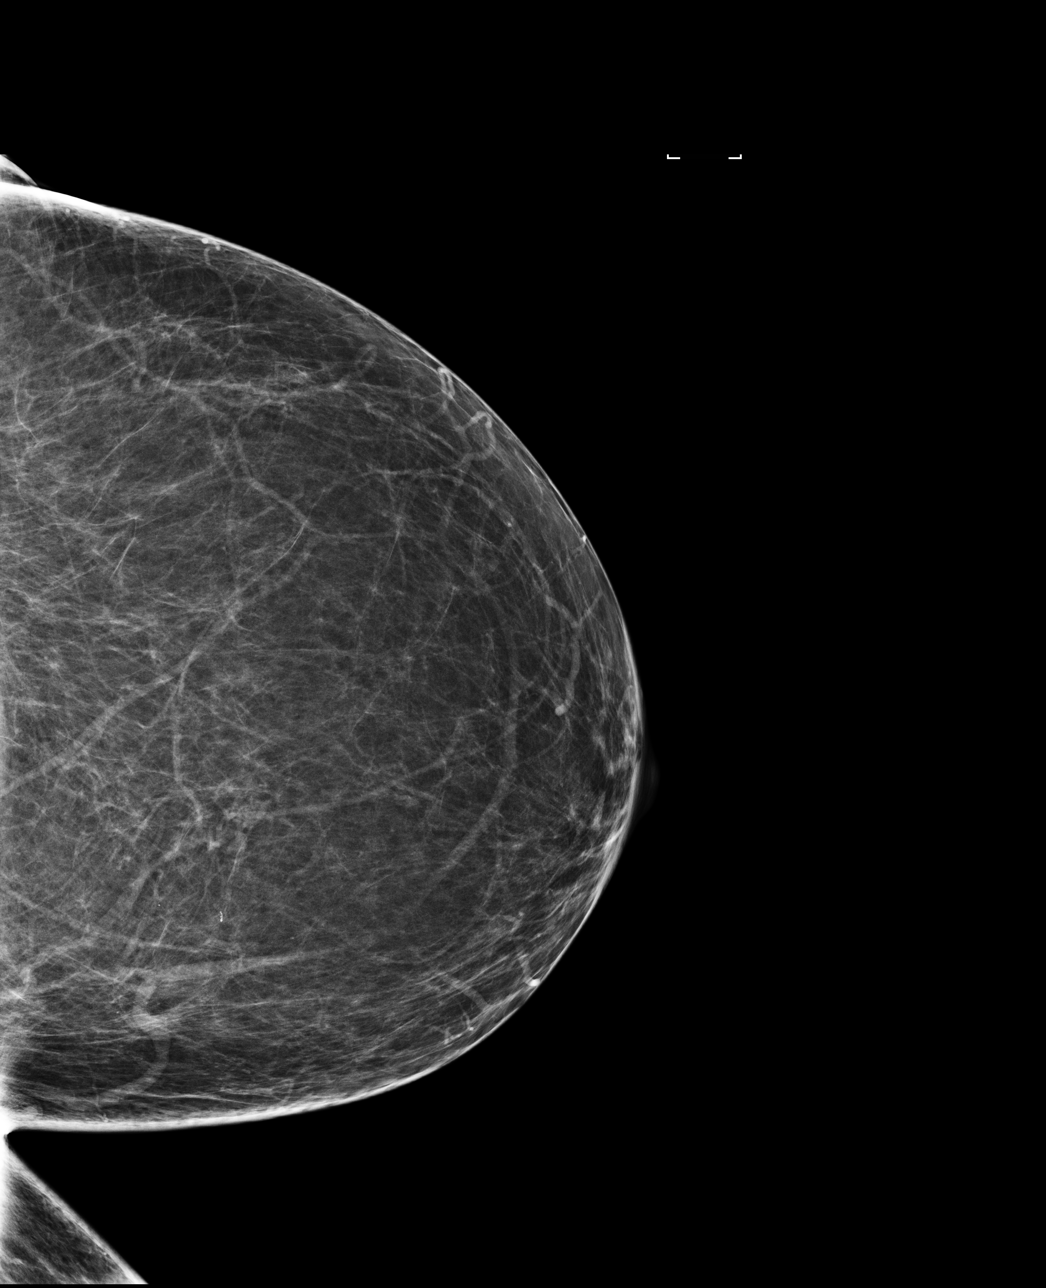

[4 of 4 positions shown; findings below may reference images not displayed]

FINDINGS: There are no findings suspicious for malignancy. Images were
processed with CAD.
IMPRESSION: No mammographic evidence of malignancy. A result letter of this
screening mammogram will be mailed directly to the patient.

RECOMMENDATION:
Screening mammogram in one year. (Code:[id number])

BI-RADS CATEGORY  1: Negative.

## 2018-01-17 ENCOUNTER — Other Ambulatory Visit: Payer: Self-pay | Admitting: *Deleted

## 2018-01-17 MED ORDER — HYDROCHLOROTHIAZIDE 25 MG PO TABS: ORAL_TABLET | ORAL | 1 refills | Status: DC

## 2018-02-12 ENCOUNTER — Ambulatory Visit (INDEPENDENT_AMBULATORY_CARE_PROVIDER_SITE_OTHER): Payer: Federal, State, Local not specified - PPO | Admitting: Family Medicine

## 2018-02-12 ENCOUNTER — Encounter: Payer: Self-pay | Admitting: Family Medicine

## 2018-02-12 VITALS — BP 124/80 | HR 72 | Temp 98.7°F | Ht 62.75 in | Wt 178.5 lb

## 2018-02-12 DIAGNOSIS — Z1211 Encounter for screening for malignant neoplasm of colon: Secondary | ICD-10-CM

## 2018-02-12 DIAGNOSIS — I10 Essential (primary) hypertension: Secondary | ICD-10-CM

## 2018-02-12 DIAGNOSIS — E7800 Pure hypercholesterolemia, unspecified: Secondary | ICD-10-CM | POA: Diagnosis not present

## 2018-02-12 DIAGNOSIS — H6120 Impacted cerumen, unspecified ear: Secondary | ICD-10-CM | POA: Insufficient documentation

## 2018-02-12 DIAGNOSIS — H6123 Impacted cerumen, bilateral: Secondary | ICD-10-CM

## 2018-02-12 DIAGNOSIS — Z0000 Encounter for general adult medical examination without abnormal findings: Secondary | ICD-10-CM

## 2018-02-12 DIAGNOSIS — E669 Obesity, unspecified: Secondary | ICD-10-CM | POA: Insufficient documentation

## 2018-02-12 LAB — LIPID PANEL
Cholesterol: 212 mg/dL — ABNORMAL HIGH (ref 0–200)
HDL: 64.3 mg/dL (ref >39.00)
LDL Cholesterol: 139 mg/dL — ABNORMAL HIGH (ref 0–99)
NonHDL: 147.86
Total CHOL/HDL Ratio: 3
Triglycerides: 45 mg/dL (ref 0.0–149.0)
VLDL: 9 mg/dL (ref 0.0–40.0)

## 2018-02-12 LAB — COMPREHENSIVE METABOLIC PANEL
ALT: 13 U/L (ref 0–35)
AST: 21 U/L (ref 0–37)
Albumin: 4.2 g/dL (ref 3.5–5.2)
Alkaline Phosphatase: 64 U/L (ref 39–117)
BUN: 19 mg/dL (ref 6–23)
CO2: 31 mEq/L (ref 19–32)
Calcium: 9.6 mg/dL (ref 8.4–10.5)
Chloride: 102 mEq/L (ref 96–112)
Creatinine, Ser: 0.81 mg/dL (ref 0.40–1.20)
GFR: 92.35 mL/min (ref >60.00)
Glucose, Bld: 86 mg/dL (ref 70–99)
Potassium: 3.7 mEq/L (ref 3.5–5.1)
Sodium: 139 mEq/L (ref 135–145)
Total Bilirubin: 0.8 mg/dL (ref 0.2–1.2)
Total Protein: 7.4 g/dL (ref 6.0–8.3)

## 2018-02-12 LAB — CBC WITH DIFFERENTIAL/PLATELET
Basophils Absolute: 0 10*3/uL (ref 0.0–0.1)
Basophils Relative: 0.7 % (ref 0.0–3.0)
Eosinophils Absolute: 0.1 10*3/uL (ref 0.0–0.7)
Eosinophils Relative: 1.8 % (ref 0.0–5.0)
HCT: 40.1 % (ref 36.0–46.0)
Hemoglobin: 13.7 g/dL (ref 12.0–15.0)
Lymphocytes Relative: 36.2 % (ref 12.0–46.0)
Lymphs Abs: 1.8 10*3/uL (ref 0.7–4.0)
MCHC: 34.3 g/dL (ref 30.0–36.0)
MCV: 90.9 fl (ref 78.0–100.0)
Monocytes Absolute: 0.4 10*3/uL (ref 0.1–1.0)
Monocytes Relative: 8.2 % (ref 3.0–12.0)
Neutro Abs: 2.7 10*3/uL (ref 1.4–7.7)
Neutrophils Relative %: 53.1 % (ref 43.0–77.0)
Platelets: 290 10*3/uL (ref 150.0–400.0)
RBC: 4.41 Mil/uL (ref 3.87–5.11)
RDW: 13.4 % (ref 11.5–15.5)
WBC: 5.1 10*3/uL (ref 4.0–10.5)

## 2018-02-12 LAB — TSH: TSH: 1.91 u[IU]/mL (ref 0.35–4.50)

## 2018-02-12 MED ORDER — HYDROCHLOROTHIAZIDE 25 MG PO TABS: ORAL_TABLET | ORAL | 3 refills | Status: DC

## 2018-02-12 NOTE — Assessment & Plan Note (Signed)
Declines colonoscopy  Given info on cologuard- doubts it will be covered ifob kit done today

## 2018-02-12 NOTE — Assessment & Plan Note (Signed)
Bilateral  Enc her to f/u for ear irrigation visit (or go to [facility] where she has gone before)

## 2018-02-12 NOTE — Patient Instructions (Addendum)
To help weight  Try to get most of your carbohydrates from produce (with the exception of white potatoes)  Eat less bread/pasta/[place]/snack foods/cereals/sweets and other items from the middle of the grocery store (processed carbs)   Do the ifob kit for colon screening  See if your insurance covers cologuard for the future   Your mammogram is due in Nov- don't forget to schedule it   Avoid red meat/ fried foods/ egg yolks/ fatty breakfast meats/ butter, cheese and high fat dairy/ and shellfish  -for cholesterol   You have wax in your ears  You should get them flushed hear or at another clinic   Labs today

## 2018-02-12 NOTE — Assessment & Plan Note (Signed)
bp in fair control at this time  BP Readings from Last 1 Encounters:  02/12/18 124/80   No changes needed Most recent labs reviewed  Disc lifstyle change with low sodium diet and exercise  HCTZ refilled Enc wt loss

## 2018-02-12 NOTE — Assessment & Plan Note (Signed)
Discussed how this problem influences overall health and the risks it imposes  Reviewed plan for weight loss with lower calorie diet (via better food choices and also portion control or program like weight watchers) and exercise building up to or more than 30 minutes 5 days per week including some aerobic activity    

## 2018-02-12 NOTE — Progress Notes (Signed)
Subjective:    Patient ID: Teresa Stephens, female    DOB: 1955-12-29, 62 y.o.   MRN: [id number]  HPI  Here for health maintenance exam and to review chronic medical problems    Just returned from vacation  Rode horses  Ate a lot  In the [employer] Readings from Last 3 Encounters:  02/12/18 178 lb 8 oz (81 kg)  11/29/16 174 lb 4 oz (79 kg)  10/26/15 169 lb 4 oz (76.8 kg)  up 4 lb from vacation  Walks for exercise -needs to do more  Diet - (when not on vacation) - is fair -- avoids fried foods /eating a lot of spinach Loves pasta  31.87 kg/m   Colon cancer screening - ifob 7/18 neg Will do that this year kl Declines colonoscopy   Declines flu shot   Tetanus shot 4/14  Zoster status - declines   Mammogram 11/18  Self breast exam - no lumps or changes / occ aches in breasts  Pap 7/18 -neg pap and neg hpv  No gyn symptoms    bp is stable today  No cp or palpitations or headaches or edema  No side effects to medicines  BP Readings from Last 3 Encounters:  02/12/18 124/80  11/29/16 135/80  10/26/15 135/80    Takes hctz   Hyperlipidemia Lab Results  Component Value Date   CHOL 206 (H) 11/25/2016   HDL 55.00 11/25/2016   LDLCALC 138 (H) 11/25/2016   LDLDIRECT 125.2 11/19/2009   TRIG 61.0 11/25/2016   CHOLHDL 4 11/25/2016  declines statin  She thinks her cholesterol will be improved (ate lots of shrimp last time)    Patient Active Problem List   Diagnosis Date Noted  . Cerumen impaction 02/12/2018  . Obesity (BMI 30-39.9) 02/12/2018  . Encounter for screening mammogram for breast cancer 11/29/2016  . Hyperlipidemia 11/29/2016  . Colon cancer screening 08/21/2012  . Encounter for routine gynecological examination 08/21/2012  . Other screening mammogram 04/12/2011  . Routine general medical examination at a health care facility 03/07/2011  . SCIATICA, LEFT 06/02/2010  . LUMBAR RADICULOPATHY, LEFT 06/02/2010  . NONSPEC ELEVATION OF LEVELS OF  TRANSAMINASE/LDH 04/24/2007  . Essential hypertension 01/01/2007  . ACID REFLUX DISEASE 01/01/2007   Past Medical History:  Diagnosis Date  . ASCUS (atypical squamous cells of undetermined significance) on Pap smear 1997   ok on repeat (10/98)  . GERD (gastroesophageal reflux disease)   . Helicobacter pylori infection    past history  . HTN (hypertension)    History reviewed. No pertinent surgical history. Social History   Tobacco Use  . Smoking status: Never Smoker  . Smokeless tobacco: Never Used  Substance Use Topics  . Alcohol use: Yes    Alcohol/week: 0.0 standard drinks    Comment: Occasional wine  . Drug use: No   Family History  Problem Relation Age of Onset  . Kidney disease Father   . Hypertension Father   . Diabetes Father   . Heart attack Mother   . Hypertension Mother   . Lung cancer Unknown        uncle  . Breast cancer Cousin 48   Allergies  Allergen Reactions  . Cephalexin     REACTION: GI  . Nizatidine     REACTION: nausea   Current Outpatient Medications on File Prior to Visit  Medication Sig Dispense Refill  . ibuprofen (ADVIL,MOTRIN) 200 MG tablet Take 200 mg by mouth every 6 (  six) hours as needed.       No current facility-administered medications on file prior to visit.     Review of Systems  Constitutional: Negative for activity change, appetite change, fatigue, fever and unexpected weight change.  HENT: Negative for congestion, ear pain, rhinorrhea, sinus pressure and sore throat.        Prone to wax in ears  Eyes: Negative for pain, redness and visual disturbance.  Respiratory: Negative for cough, shortness of breath and wheezing.   Cardiovascular: Negative for chest pain and palpitations.  Gastrointestinal: Negative for abdominal pain, blood in stool, constipation and diarrhea.  Endocrine: Negative for polydipsia and polyuria.  Genitourinary: Negative for dysuria, frequency and urgency.  Musculoskeletal: Negative for arthralgias,  back pain and myalgias.  Skin: Negative for pallor and rash.  Allergic/Immunologic: Negative for environmental allergies.  Neurological: Negative for dizziness, syncope and headaches.  Hematological: Negative for adenopathy. Does not bruise/bleed easily.  Psychiatric/Behavioral: Negative for decreased concentration and dysphoric mood. The patient is not nervous/anxious.        Very afraid of needles       Objective:   Physical Exam  Constitutional: She appears well-developed and well-nourished. No distress.  obese and well appearing   HENT:  Head: Normocephalic and atraumatic.  Right Ear: External ear normal.  Left Ear: External ear normal.  Mouth/Throat: Oropharynx is clear and moist.  Bilateral cerumen impaction   Eyes: Pupils are equal, round, and reactive to light. Conjunctivae and EOM are normal. Right eye exhibits no discharge. Left eye exhibits no discharge. No scleral icterus.  Neck: Normal range of motion. Neck supple. No JVD present. Carotid bruit is not present. No thyromegaly present.  Cardiovascular: Normal rate, regular rhythm, normal heart sounds and intact distal pulses. Exam reveals no gallop.  Pulmonary/Chest: Effort normal and breath sounds normal. No respiratory distress. She has no wheezes. She has no rales. She exhibits no tenderness. No breast tenderness, discharge or bleeding.  Abdominal: Soft. Bowel sounds are normal. She exhibits no distension, no abdominal bruit and no mass. There is no tenderness.  Genitourinary: No breast tenderness, discharge or bleeding.  Genitourinary Comments: Breast exam: No mass, nodules, thickening, tenderness, bulging, retraction, inflamation, nipple discharge or skin changes noted.  No axillary or clavicular LA.      Musculoskeletal: Normal range of motion. She exhibits no edema or tenderness.  No kyphosis   Lymphadenopathy:    She has no cervical adenopathy.  Neurological: She is alert. She has normal reflexes. She displays normal  reflexes. No cranial nerve deficit. She exhibits normal muscle tone. Coordination normal.  Skin: Skin is warm and dry. No rash noted. No erythema. No pallor.  Clear skin   Psychiatric: She has a normal mood and affect.  Pleasant and talkative          Assessment & Plan:   Problem List Items Addressed This Visit      Cardiovascular and Mediastinum   Essential hypertension    bp in fair control at this time  BP Readings from Last 1 Encounters:  02/12/18 124/80   No changes needed Most recent labs reviewed  Disc lifstyle change with low sodium diet and exercise  HCTZ refilled Enc wt loss       Relevant Medications   hydrochlorothiazide (HYDRODIURIL) 25 MG tablet   Other Relevant Orders   CBC with Differential/Platelet   Comprehensive metabolic panel   Lipid panel   TSH     Nervous and Auditory   Cerumen impaction  Bilateral  Enc her to f/u for ear irrigation visit (or go to [facility] where she has gone before)        Other   Colon cancer screening    Declines colonoscopy  Given info on cologuard- doubts it will be covered ifob kit done today      Hyperlipidemia    Pt has cut shellfish intake and avoids fried foods Disc goals for lipids and reasons to control them Rev last labs with pt Rev low sat fat diet in detail Labs today-hope for improvement She declines statin tx in the past      Relevant Medications   hydrochlorothiazide (HYDRODIURIL) 25 MG tablet   Obesity (BMI 30-39.9)    Discussed how this problem influences overall health and the risks it imposes  Reviewed plan for weight loss with lower calorie diet (via better food choices and also portion control or program like weight watchers) and exercise building up to or more than 30 minutes 5 days per week including some aerobic activity         Routine general medical examination at a health care facility - Primary    Reviewed health habits including diet and exercise and skin cancer  prevention Reviewed appropriate screening tests for age  Also reviewed health mt list, fam hx and immunization status , as well as social and family history   See HPI Labs drawn  Declines flu shot or shingles vaccine  Declines colonoscopy - ifob kit given  Mammogram due in nov- she will schedule herself/nl exam  Counseled on diet/exercise for wt loss and better health  Enc her to f/u for ear irrigation for cerumen

## 2018-02-12 NOTE — Assessment & Plan Note (Signed)
Reviewed health habits including diet and exercise and skin cancer prevention Reviewed appropriate screening tests for age  Also reviewed health mt list, fam hx and immunization status , as well as social and family history   See HPI Labs drawn  Declines flu shot or shingles vaccine  Declines colonoscopy - ifob kit given  Mammogram due in nov- she will schedule herself/nl exam  Counseled on diet/exercise for wt loss and better health  Enc her to f/u for ear irrigation for cerumen

## 2018-02-12 NOTE — Assessment & Plan Note (Signed)
Pt has cut shellfish intake and avoids fried foods Disc goals for lipids and reasons to control them Rev last labs with pt Rev low sat fat diet in detail Labs today-hope for improvement She declines statin tx in the past

## 2018-02-22 DIAGNOSIS — H6123 Impacted cerumen, bilateral: Secondary | ICD-10-CM | POA: Diagnosis not present

## 2018-08-10 ENCOUNTER — Telehealth: Payer: Self-pay | Admitting: Family Medicine

## 2018-08-10 NOTE — Telephone Encounter (Signed)
Patient has appointment to recheck labs.  I left a message for patient to return my call.  Please reschedule appointment to May.

## 2018-08-13 ENCOUNTER — Other Ambulatory Visit: Payer: Federal, State, Local not specified - PPO

## 2018-10-22 ENCOUNTER — Telehealth: Payer: Self-pay | Admitting: Family Medicine

## 2018-10-22 DIAGNOSIS — E7800 Pure hypercholesterolemia, unspecified: Secondary | ICD-10-CM

## 2018-10-22 NOTE — Telephone Encounter (Signed)
-----   Message from Alvina Chou sent at 10/19/2018  4:33 PM EDT ----- Regarding: Lab orders for Wednesday, 6.3.20 Lab orders

## 2018-10-24 ENCOUNTER — Other Ambulatory Visit: Payer: Federal, State, Local not specified - PPO

## 2018-10-24 ENCOUNTER — Telehealth: Payer: Self-pay

## 2018-10-24 NOTE — Telephone Encounter (Signed)
It was not an office visit it was just a lab appt, but lab appt cancelled

## 2018-10-24 NOTE — Telephone Encounter (Signed)
[facility] Night - Client Nonclinical Telephone Record AccessNurse Client [facility] Night - Client Client Site [facility] - Night Contact Type Call Who Is Calling Patient / Member / Family / Caregiver Caller Name Teresa Stephens Caller Phone Number [phone] Patient Name Teresa Stephens Patient DOB N/A Call Type Message Only Information Provided Reason for Call Request to [facility] Appointment Initial Comment Caller states has an appt tomorrow that she is needing to cancel. Additional Comment Call Closed By: Macario Carls Transaction Date/

## 2018-12-28 DIAGNOSIS — S90821A Blister (nonthermal), right foot, initial encounter: Secondary | ICD-10-CM | POA: Diagnosis not present

## 2018-12-28 DIAGNOSIS — T63441A Toxic effect of venom of bees, accidental (unintentional), initial encounter: Secondary | ICD-10-CM | POA: Diagnosis not present

## 2018-12-28 DIAGNOSIS — L03115 Cellulitis of right lower limb: Secondary | ICD-10-CM | POA: Diagnosis not present

## 2019-02-26 ENCOUNTER — Other Ambulatory Visit: Payer: Self-pay | Admitting: *Deleted

## 2019-02-26 MED ORDER — HYDROCHLOROTHIAZIDE 25 MG PO TABS: ORAL_TABLET | ORAL | 0 refills | Status: DC

## 2019-03-03 ENCOUNTER — Telehealth: Payer: Self-pay | Admitting: Family Medicine

## 2019-03-03 DIAGNOSIS — I10 Essential (primary) hypertension: Secondary | ICD-10-CM

## 2019-03-03 DIAGNOSIS — E7800 Pure hypercholesterolemia, unspecified: Secondary | ICD-10-CM

## 2019-03-03 NOTE — Telephone Encounter (Signed)
-----   Message from Ellamae Sia sent at 02/25/2019 10:47 AM EDT ----- Regarding: Lab orders fro Monday, 10.12.20 Patient is scheduled for CPX labs, please order future labs, Thanks , Karna Christmas

## 2019-03-04 ENCOUNTER — Other Ambulatory Visit: Payer: Federal, State, Local not specified - PPO

## 2019-03-04 ENCOUNTER — Telehealth: Payer: Self-pay | Admitting: Family Medicine

## 2019-03-04 DIAGNOSIS — E7800 Pure hypercholesterolemia, unspecified: Secondary | ICD-10-CM

## 2019-03-04 DIAGNOSIS — Z0000 Encounter for general adult medical examination without abnormal findings: Secondary | ICD-10-CM

## 2019-03-04 DIAGNOSIS — I10 Essential (primary) hypertension: Secondary | ICD-10-CM

## 2019-03-04 NOTE — Telephone Encounter (Signed)
-----   Message from Cloyd Stagers, RT sent at 03/01/2019  1:35 PM EDT ----- Regarding: Lab Orders for Tuesday 10.13.2020 Please place lab orders for Tuesday 10.13.2020, office visit for physical on Monday 10.19.2020 I saw a lipid order from 6.2020 but thought you may want more for her annual... Thank you, Dyke Maes RT(R)

## 2019-03-05 ENCOUNTER — Other Ambulatory Visit (INDEPENDENT_AMBULATORY_CARE_PROVIDER_SITE_OTHER): Payer: Federal, State, Local not specified - PPO

## 2019-03-05 DIAGNOSIS — I10 Essential (primary) hypertension: Secondary | ICD-10-CM

## 2019-03-05 DIAGNOSIS — E7800 Pure hypercholesterolemia, unspecified: Secondary | ICD-10-CM

## 2019-03-05 LAB — COMPREHENSIVE METABOLIC PANEL
ALT: 6 U/L (ref 0–35)
AST: 15 U/L (ref 0–37)
Albumin: 4 g/dL (ref 3.5–5.2)
Alkaline Phosphatase: 67 U/L (ref 39–117)
BUN: 15 mg/dL (ref 6–23)
CO2: 29 mEq/L (ref 19–32)
Calcium: 9.5 mg/dL (ref 8.4–10.5)
Chloride: 105 mEq/L (ref 96–112)
Creatinine, Ser: 0.77 mg/dL (ref 0.40–1.20)
GFR: 91.8 mL/min (ref >60.00)
Glucose, Bld: 87 mg/dL (ref 70–99)
Potassium: 3.6 mEq/L (ref 3.5–5.1)
Sodium: 140 mEq/L (ref 135–145)
Total Bilirubin: 0.9 mg/dL (ref 0.2–1.2)
Total Protein: 6.9 g/dL (ref 6.0–8.3)

## 2019-03-05 LAB — LIPID PANEL
Cholesterol: 218 mg/dL — ABNORMAL HIGH (ref 0–200)
HDL: 56 mg/dL (ref >39.00)
LDL Cholesterol: 149 mg/dL — ABNORMAL HIGH (ref 0–99)
NonHDL: 161.61
Total CHOL/HDL Ratio: 4
Triglycerides: 65 mg/dL (ref 0.0–149.0)
VLDL: 13 mg/dL (ref 0.0–40.0)

## 2019-03-05 LAB — CBC WITH DIFFERENTIAL/PLATELET
Basophils Absolute: 0 10*3/uL (ref 0.0–0.1)
Basophils Relative: 0.6 % (ref 0.0–3.0)
Eosinophils Absolute: 0.2 10*3/uL (ref 0.0–0.7)
Eosinophils Relative: 3.1 % (ref 0.0–5.0)
HCT: 39 % (ref 36.0–46.0)
Hemoglobin: 13.4 g/dL (ref 12.0–15.0)
Lymphocytes Relative: 40.3 % (ref 12.0–46.0)
Lymphs Abs: 2.1 10*3/uL (ref 0.7–4.0)
MCHC: 34.2 g/dL (ref 30.0–36.0)
MCV: 90.9 fl (ref 78.0–100.0)
Monocytes Absolute: 0.6 10*3/uL (ref 0.1–1.0)
Monocytes Relative: 10.9 % (ref 3.0–12.0)
Neutro Abs: 2.4 10*3/uL (ref 1.4–7.7)
Neutrophils Relative %: 45.1 % (ref 43.0–77.0)
Platelets: 270 10*3/uL (ref 150.0–400.0)
RBC: 4.29 Mil/uL (ref 3.87–5.11)
RDW: 13.3 % (ref 11.5–15.5)
WBC: 5.3 10*3/uL (ref 4.0–10.5)

## 2019-03-05 LAB — TSH: TSH: 3.6 u[IU]/mL (ref 0.35–4.50)

## 2019-03-11 ENCOUNTER — Encounter: Payer: Federal, State, Local not specified - PPO | Admitting: Family Medicine

## 2019-03-18 ENCOUNTER — Ambulatory Visit (INDEPENDENT_AMBULATORY_CARE_PROVIDER_SITE_OTHER): Payer: Federal, State, Local not specified - PPO | Admitting: Family Medicine

## 2019-03-18 ENCOUNTER — Other Ambulatory Visit: Payer: Self-pay

## 2019-03-18 ENCOUNTER — Encounter: Payer: Self-pay | Admitting: Family Medicine

## 2019-03-18 VITALS — BP 135/80 | HR 77 | Temp 96.6°F | Ht 63.0 in | Wt 190.2 lb

## 2019-03-18 DIAGNOSIS — Z1211 Encounter for screening for malignant neoplasm of colon: Secondary | ICD-10-CM

## 2019-03-18 DIAGNOSIS — Z0000 Encounter for general adult medical examination without abnormal findings: Secondary | ICD-10-CM | POA: Diagnosis not present

## 2019-03-18 DIAGNOSIS — E7800 Pure hypercholesterolemia, unspecified: Secondary | ICD-10-CM

## 2019-03-18 DIAGNOSIS — I10 Essential (primary) hypertension: Secondary | ICD-10-CM | POA: Diagnosis not present

## 2019-03-18 DIAGNOSIS — E669 Obesity, unspecified: Secondary | ICD-10-CM

## 2019-03-18 DIAGNOSIS — Z1231 Encounter for screening mammogram for malignant neoplasm of breast: Secondary | ICD-10-CM | POA: Diagnosis not present

## 2019-03-18 MED ORDER — HYDROCHLOROTHIAZIDE 25 MG PO TABS: ORAL_TABLET | ORAL | 3 refills | Status: DC

## 2019-03-18 NOTE — Assessment & Plan Note (Signed)
LDL chol is up  Good HDL  Disc goals for lipids and reasons to control them Rev last labs with pt Rev low sat fat diet in detail  Pt is resistant to medications  Asked her to consider low dose crestor-she will consider it

## 2019-03-18 NOTE — Assessment & Plan Note (Signed)
Discussed how this problem influences overall health and the risks it imposes  Reviewed plan for weight loss with lower calorie diet (via better food choices and also portion control or program like weight watchers) and exercise building up to or more than 30 minutes 5 days per week including some aerobic activity   Will try to cut portions (after eating much during pandemic)

## 2019-03-18 NOTE — Assessment & Plan Note (Signed)
Declines colonoscopy Given ifob kit 

## 2019-03-18 NOTE — Progress Notes (Signed)
Subjective:    Patient ID: Teresa Stephens, female    DOB: 02-16-1956, 63 y.o.   MRN: [id number]  HPI Here for health maintenance exam and to review chronic medical problems   Feeling ok in general  Wt Readings from Last 3 Encounters:  03/18/19 190 lb 4 oz (86.3 kg)  02/12/18 178 lb 8 oz (81 kg)  11/29/16 174 lb 4 oz (79 kg)  her scale is lower  33.70 kg/m   Gained weight  Not getting out as much  Diet is so/so-not a super healthy eater at home Eating more in general  Just started back to exercise - walking (with a friend) -started Tuesday About 3 miles at a time   She feels ready to cut back portions  Colon cancer screen- declines colonoscopy  ifob neg in 7/18 Wants to do that again   Mammogram 11/18- overdue / scheduled today Self breast exam -no lumps   Declines flu shots and zoster vaccine    Pap 7/18-negative with neg HPV No gyn problems or symptoms  No new partners - monogamous  Declines std testing  Hypertension bp is stable today  No cp or palpitations or headaches or edema  No side effects to medicines  BP Readings from Last 3 Encounters:  03/18/19 (!) 144/92  02/12/18 124/80  11/29/16 135/80      Improved 2nd check BP: 135/80    Cholesterol Hyperlipidemia  Lab Results  Component Value Date   CHOL 218 (H) 03/05/2019   CHOL 212 (H) 02/12/2018   CHOL 206 (H) 11/25/2016   Lab Results  Component Value Date   HDL 56.00 03/05/2019   HDL 64.30 02/12/2018   HDL 55.00 11/25/2016   Lab Results  Component Value Date   LDLCALC 149 (H) 03/05/2019   LDLCALC 139 (H) 02/12/2018   LDLCALC 138 (H) 11/25/2016   Lab Results  Component Value Date   TRIG 65.0 03/05/2019   TRIG 45.0 02/12/2018   TRIG 61.0 11/25/2016   Lab Results  Component Value Date   CHOLHDL 4 03/05/2019   CHOLHDL 3 02/12/2018   CHOLHDL 4 11/25/2016   Lab Results  Component Value Date   LDLDIRECT 125.2 11/19/2009  she wants to watch diet  She is comfortable with her  cholesterol being high  Mother died of MI - young in 60s  She declines medication   Other labs Results for orders placed or performed in visit on 03/05/19  TSH  Result Value Ref Range   TSH 3.60 0.35 - 4.50 uIU/mL  Lipid panel  Result Value Ref Range   Cholesterol 218 (H) 0 - 200 mg/dL   Triglycerides 65.0 0.0 - 149.0 mg/dL   HDL 56.00 >39.00 mg/dL   VLDL 13.0 0.0 - 40.0 mg/dL   LDL Cholesterol 149 (H) 0 - 99 mg/dL   Total CHOL/HDL Ratio 4    NonHDL 161.61   Comprehensive metabolic panel  Result Value Ref Range   Sodium 140 135 - 145 mEq/L   Potassium 3.6 3.5 - 5.1 mEq/L   Chloride 105 96 - 112 mEq/L   CO2 29 19 - 32 mEq/L   Glucose, Bld 87 70 - 99 mg/dL   BUN 15 6 - 23 mg/dL   Creatinine, Ser 0.77 0.40 - 1.20 mg/dL   Total Bilirubin 0.9 0.2 - 1.2 mg/dL   Alkaline Phosphatase 67 39 - 117 U/L   AST 15 0 - 37 U/L   ALT 6 0 - 35 U/L  Total Protein 6.9 6.0 - 8.3 g/dL   Albumin 4.0 3.5 - 5.2 g/dL   Calcium 9.5 8.4 - 10.5 mg/dL   GFR 91.80 >60.00 mL/min  CBC with Differential/Platelet  Result Value Ref Range   WBC 5.3 4.0 - 10.5 K/uL   RBC 4.29 3.87 - 5.11 Mil/uL   Hemoglobin 13.4 12.0 - 15.0 g/dL   HCT 39.0 36.0 - 46.0 %   MCV 90.9 78.0 - 100.0 fl   MCHC 34.2 30.0 - 36.0 g/dL   RDW 13.3 11.5 - 15.5 %   Platelets 270.0 150.0 - 400.0 K/uL   Neutrophils Relative % 45.1 43.0 - 77.0 %   Lymphocytes Relative 40.3 12.0 - 46.0 %   Monocytes Relative 10.9 3.0 - 12.0 %   Eosinophils Relative 3.1 0.0 - 5.0 %   Basophils Relative 0.6 0.0 - 3.0 %   Neutro Abs 2.4 1.4 - 7.7 K/uL   Lymphs Abs 2.1 0.7 - 4.0 K/uL   Monocytes Absolute 0.6 0.1 - 1.0 K/uL   Eosinophils Absolute 0.2 0.0 - 0.7 K/uL   Basophils Absolute 0.0 0.0 - 0.1 K/uL      Patient Active Problem List   Diagnosis Date Noted  . Cerumen impaction 02/12/2018  . Obesity (BMI 30-39.9) 02/12/2018  . Encounter for screening mammogram for breast cancer 11/29/2016  . Hyperlipidemia 11/29/2016  . Colon cancer screening  08/21/2012  . Encounter for routine gynecological examination 08/21/2012  . Other screening mammogram 04/12/2011  . Routine general medical examination at a health care facility 03/07/2011  . SCIATICA, LEFT 06/02/2010  . LUMBAR RADICULOPATHY, LEFT 06/02/2010  . NONSPEC ELEVATION OF LEVELS OF TRANSAMINASE/LDH 04/24/2007  . Essential hypertension 01/01/2007  . ACID REFLUX DISEASE 01/01/2007   Past Medical History:  Diagnosis Date  . ASCUS (atypical squamous cells of undetermined significance) on Pap smear 1997   ok on repeat (10/98)  . GERD (gastroesophageal reflux disease)   . Helicobacter pylori infection    past history  . HTN (hypertension)    History reviewed. No pertinent surgical history. Social History   Tobacco Use  . Smoking status: Never Smoker  . Smokeless tobacco: Never Used  Substance Use Topics  . Alcohol use: Yes    Alcohol/week: 0.0 standard drinks    Comment: Occasional wine  . Drug use: No   Family History  Problem Relation Age of Onset  . Kidney disease Father   . Hypertension Father   . Diabetes Father   . Heart attack Mother   . Hypertension Mother   . Lung cancer Unknown        uncle  . Breast cancer Cousin 48   Allergies  Allergen Reactions  . Cephalexin     REACTION: GI  . Nizatidine     REACTION: nausea  . Wasp Venom     Local reaction   Current Outpatient Medications on File Prior to Visit  Medication Sig Dispense Refill  . ibuprofen (ADVIL,MOTRIN) 200 MG tablet Take 200 mg by mouth every 6 (six) hours as needed.       No current facility-administered medications on file prior to visit.     Review of Systems  Constitutional: Negative for activity change, appetite change, fatigue, fever and unexpected weight change.  HENT: Negative for congestion, ear pain, rhinorrhea, sinus pressure and sore throat.   Eyes: Negative for pain, redness and visual disturbance.  Respiratory: Negative for cough, shortness of breath and wheezing.    Cardiovascular: Negative for chest pain and palpitations.  Gastrointestinal: Negative for abdominal pain, blood in stool, constipation and diarrhea.  Endocrine: Negative for polydipsia and polyuria.  Genitourinary: Negative for dysuria, frequency and urgency.  Musculoskeletal: Negative for arthralgias, back pain and myalgias.  Skin: Negative for pallor and rash.  Allergic/Immunologic: Negative for environmental allergies.  Neurological: Negative for dizziness, syncope and headaches.  Hematological: Negative for adenopathy. Does not bruise/bleed easily.  Psychiatric/Behavioral: Negative for decreased concentration and dysphoric mood. The patient is not nervous/anxious.        Objective:   Physical Exam Constitutional:      General: She is not in acute distress.    Appearance: Normal appearance. She is well-developed. She is obese. She is not ill-appearing or diaphoretic.  HENT:     Head: Normocephalic and atraumatic.     Right Ear: Tympanic membrane, ear canal and external ear normal.     Left Ear: Tympanic membrane, ear canal and external ear normal.     Nose: Nose normal. No congestion.     Mouth/Throat:     Mouth: Mucous membranes are moist.     Pharynx: Oropharynx is clear. No posterior oropharyngeal erythema.  Eyes:     General: No scleral icterus.    Extraocular Movements: Extraocular movements intact.     Conjunctiva/sclera: Conjunctivae normal.     Pupils: Pupils are equal, round, and reactive to light.     Comments: Baseline strabismus  Neck:     Musculoskeletal: Normal range of motion and neck supple. No neck rigidity or muscular tenderness.     Thyroid: No thyromegaly.     Vascular: No carotid bruit or JVD.  Cardiovascular:     Rate and Rhythm: Normal rate and regular rhythm.     Pulses: Normal pulses.     Heart sounds: Normal heart sounds. No gallop.   Pulmonary:     Effort: Pulmonary effort is normal. No respiratory distress.     Breath sounds: Normal breath  sounds. No wheezing.     Comments: Good air exch Chest:     Chest wall: No tenderness.  Abdominal:     General: Bowel sounds are normal. There is no distension or abdominal bruit.     Palpations: Abdomen is soft. There is no mass.     Tenderness: There is no abdominal tenderness.     Hernia: No hernia is present.  Genitourinary:    Comments: Breast exam: No mass, nodules, thickening, tenderness, bulging, retraction, inflamation, nipple discharge or skin changes noted.  No axillary or clavicular LA.     Musculoskeletal: Normal range of motion.        General: No tenderness.     Right lower leg: No edema.     Left lower leg: No edema.  Lymphadenopathy:     Cervical: No cervical adenopathy.  Skin:    General: Skin is warm and dry.     Coloration: Skin is not pale.     Findings: No erythema or rash.     Comments: Few skin tags  Neurological:     Mental Status: She is alert. Mental status is at baseline.     Cranial Nerves: No cranial nerve deficit.     Motor: No abnormal muscle tone.     Coordination: Coordination normal.     Gait: Gait normal.     Deep Tendon Reflexes: Reflexes are normal and symmetric. Reflexes normal.  Psychiatric:        Mood and Affect: Mood normal.        Cognition and  Memory: Cognition and memory normal.           Assessment & Plan:   Problem List Items Addressed This Visit      Cardiovascular and Mediastinum   Essential hypertension    bp in fair control at this time  BP Readings from Last 1 Encounters:  03/18/19 135/80   No changes needed Most recent labs reviewed  Disc lifstyle change with low sodium diet and exercise        Relevant Medications   hydrochlorothiazide (HYDRODIURIL) 25 MG tablet     Other   Routine general medical examination at a health care facility - Primary    Reviewed health habits including diet and exercise and skin cancer prevention Reviewed appropriate screening tests for age  Also reviewed health mt list,  fam hx and immunization status , as well as social and family history   See HPI Labs reviewed Mammogram ordered  Pt declines colonoscopy - given ifob kit  Declines flu shots and shingrix         Colon cancer screening    Declines colonoscopy  Given ifob kit      Encounter for screening mammogram for breast cancer    Scheduled annual screening mammogram Nl breast exam today  Encouraged monthly self exams        Relevant Orders   MM 3D SCREEN BREAST BILATERAL   Hyperlipidemia    LDL chol is up  Good HDL  Disc goals for lipids and reasons to control them Rev last labs with pt Rev low sat fat diet in detail  Pt is resistant to medications  Asked her to consider low dose crestor-she will consider it       Relevant Medications   hydrochlorothiazide (HYDRODIURIL) 25 MG tablet   Obesity (BMI 30-39.9)    Discussed how this problem influences overall health and the risks it imposes  Reviewed plan for weight loss with lower calorie diet (via better food choices and also portion control or program like weight watchers) and exercise building up to or more than 30 minutes 5 days per week including some aerobic activity   Will try to cut portions (after eating much during pandemic)

## 2019-03-18 NOTE — Assessment & Plan Note (Signed)
Scheduled annual screening mammogram Nl breast exam today  Encouraged monthly self exams   

## 2019-03-18 NOTE — Assessment & Plan Note (Signed)
bp in fair control at this time  BP Readings from Last 1 Encounters:  03/18/19 135/80   No changes needed Most recent labs reviewed  Disc lifstyle change with low sodium diet and exercise

## 2019-03-18 NOTE — Assessment & Plan Note (Signed)
Reviewed health habits including diet and exercise and skin cancer prevention Reviewed appropriate screening tests for age  Also reviewed health mt list, fam hx and immunization status , as well as social and family history   See HPI Labs reviewed Mammogram ordered  Pt declines colonoscopy - given ifob kit  Declines flu shots and shingrix

## 2019-03-18 NOTE — Patient Instructions (Addendum)
Stop at check out for mammogram referral   Please do the ifob stool kit   Your cholesterol is high You are at very high risk for heart and vascular disease  Avoid red meat/ fried foods/ egg yolks/ fatty breakfast meats/ butter, cheese and high fat dairy/ and shellfish    If we started you on a medication for cholesterol I would try crestor at 5 mg daily

## 2019-04-08 ENCOUNTER — Other Ambulatory Visit (INDEPENDENT_AMBULATORY_CARE_PROVIDER_SITE_OTHER): Payer: Federal, State, Local not specified - PPO

## 2019-04-08 ENCOUNTER — Other Ambulatory Visit: Payer: Self-pay | Admitting: Family Medicine

## 2019-04-08 DIAGNOSIS — Z1211 Encounter for screening for malignant neoplasm of colon: Secondary | ICD-10-CM

## 2019-04-08 LAB — FECAL OCCULT BLOOD, IMMUNOCHEMICAL: Fecal Occult Bld: NEGATIVE

## 2019-04-09 ENCOUNTER — Other Ambulatory Visit: Payer: Self-pay | Admitting: Family Medicine

## 2019-04-09 DIAGNOSIS — Z1211 Encounter for screening for malignant neoplasm of colon: Secondary | ICD-10-CM

## 2019-07-29 ENCOUNTER — Ambulatory Visit
Admission: RE | Admit: 2019-07-29 | Discharge: 2019-07-29 | Disposition: A | Discharge status: Home / Self Care | Payer: Federal, State, Local not specified - PPO | Source: Ambulatory Visit | Attending: Family Medicine | Admitting: Family Medicine

## 2019-07-29 DIAGNOSIS — Z1231 Encounter for screening mammogram for malignant neoplasm of breast: Secondary | ICD-10-CM | POA: Diagnosis not present

## 2019-09-13 ENCOUNTER — Other Ambulatory Visit: Payer: Self-pay

## 2019-09-13 ENCOUNTER — Ambulatory Visit: Payer: Federal, State, Local not specified - PPO | Attending: Internal Medicine

## 2019-09-13 DIAGNOSIS — Z23 Encounter for immunization: Secondary | ICD-10-CM

## 2019-09-13 NOTE — Progress Notes (Signed)
   Covid-19 Vaccination Clinic  Name:  Teresa Stephens    MRN: [id number] DOB: 03-28-1956  09/13/2019  Ms. Vanderveer was observed post Covid-19 immunization for 15 minutes without incident. She was provided with Vaccine Information Sheet and instruction to access the V-Safe system.   Ms. Lacson was instructed to call 911 with any severe reactions post vaccine: Marland Kitchen Difficulty breathing  . Swelling of face and throat  . A fast heartbeat  . A bad rash all over body  . Dizziness and weakness   Immunizations Administered    Name Date Dose VIS Date Route   Pfizer COVID-19 Vaccine 09/13/2019 11:58 AM 0.3 mL 07/17/2018 Intramuscular   Manufacturer: [employer], [employer]   Lot: [id number]   NDC: [address]-3

## 2019-10-08 ENCOUNTER — Ambulatory Visit: Payer: Federal, State, Local not specified - PPO | Attending: Internal Medicine

## 2019-10-08 DIAGNOSIS — Z23 Encounter for immunization: Secondary | ICD-10-CM

## 2019-10-08 NOTE — Progress Notes (Signed)
   Covid-19 Vaccination Clinic  Name:  ANYELIN MOGLE    MRN: [id number] DOB: [birth year]  10/08/2019  Ms. Rhine was observed post Covid-19 immunization for 15 minutes without incident. She was provided with Vaccine Information Sheet and instruction to access the V-Safe system.   Ms. Sinquefield was instructed to call 911 with any severe reactions post vaccine: Marland Kitchen Difficulty breathing  . Swelling of face and throat  . A fast heartbeat  . A bad rash all over body  . Dizziness and weakness   Immunizations Administered    Name Date Dose VIS Date Route   Pfizer COVID-19 Vaccine 10/08/2019 11:42 AM 0.3 mL 07/17/2018 Intramuscular   Manufacturer: [employer], [employer]   Lot: [id number]   NDC: [address]-2

## 2020-05-27 ENCOUNTER — Other Ambulatory Visit: Payer: Self-pay | Admitting: Family Medicine

## 2020-05-27 NOTE — Telephone Encounter (Signed)
Please schedule PE and refill until then  

## 2020-05-27 NOTE — Telephone Encounter (Signed)
Last OV was 03/18/19 no recent or future appts., please advise

## 2020-07-30 ENCOUNTER — Ambulatory Visit: Payer: Federal, State, Local not specified - PPO | Admitting: Family Medicine

## 2020-08-11 ENCOUNTER — Telehealth: Payer: Self-pay | Admitting: Family Medicine

## 2020-08-11 DIAGNOSIS — I10 Essential (primary) hypertension: Secondary | ICD-10-CM

## 2020-08-11 DIAGNOSIS — E7800 Pure hypercholesterolemia, unspecified: Secondary | ICD-10-CM

## 2020-08-11 NOTE — Telephone Encounter (Signed)
-----   Message from Aquilla Solian, RT sent at 07/29/2020 10:51 AM EST ----- Regarding: Lab Orders for Wednesday 3.23.2022 Please place lab orders for Wednesday 3.23.2022, office visit for physical on Tuesday 3.29.2022 Thank you, Jones Bales RT(R)

## 2020-08-12 ENCOUNTER — Other Ambulatory Visit: Payer: Self-pay

## 2020-08-12 ENCOUNTER — Other Ambulatory Visit (INDEPENDENT_AMBULATORY_CARE_PROVIDER_SITE_OTHER)

## 2020-08-12 DIAGNOSIS — E7800 Pure hypercholesterolemia, unspecified: Secondary | ICD-10-CM | POA: Diagnosis not present

## 2020-08-12 DIAGNOSIS — I10 Essential (primary) hypertension: Secondary | ICD-10-CM

## 2020-08-12 LAB — CBC WITH DIFFERENTIAL/PLATELET
Basophils Absolute: 0 10*3/uL (ref 0.0–0.1)
Basophils Relative: 0.8 % (ref 0.0–3.0)
Eosinophils Absolute: 0.2 10*3/uL (ref 0.0–0.7)
Eosinophils Relative: 4.2 % (ref 0.0–5.0)
HCT: 36.7 % (ref 36.0–46.0)
Hemoglobin: 12.7 g/dL (ref 12.0–15.0)
Lymphocytes Relative: 41.1 % (ref 12.0–46.0)
Lymphs Abs: 2.2 10*3/uL (ref 0.7–4.0)
MCHC: 34.5 g/dL (ref 30.0–36.0)
MCV: 89.7 fl (ref 78.0–100.0)
Monocytes Absolute: 0.5 10*3/uL (ref 0.1–1.0)
Monocytes Relative: 8.7 % (ref 3.0–12.0)
Neutro Abs: 2.4 10*3/uL (ref 1.4–7.7)
Neutrophils Relative %: 45.2 % (ref 43.0–77.0)
Platelets: 276 10*3/uL (ref 150.0–400.0)
RBC: 4.1 Mil/uL (ref 3.87–5.11)
RDW: 13.5 % (ref 11.5–15.5)
WBC: 5.4 10*3/uL (ref 4.0–10.5)

## 2020-08-12 LAB — TSH: TSH: 4.25 u[IU]/mL (ref 0.35–4.50)

## 2020-08-12 LAB — LIPID PANEL
Cholesterol: 205 mg/dL — ABNORMAL HIGH (ref 0–200)
HDL: 51.8 mg/dL (ref >39.00)
LDL Cholesterol: 140 mg/dL — ABNORMAL HIGH (ref 0–99)
NonHDL: 152.75
Total CHOL/HDL Ratio: 4
Triglycerides: 63 mg/dL (ref 0.0–149.0)
VLDL: 12.6 mg/dL (ref 0.0–40.0)

## 2020-08-12 LAB — COMPREHENSIVE METABOLIC PANEL
ALT: 9 U/L (ref 0–35)
AST: 16 U/L (ref 0–37)
Albumin: 4 g/dL (ref 3.5–5.2)
Alkaline Phosphatase: 65 U/L (ref 39–117)
BUN: 18 mg/dL (ref 6–23)
CO2: 30 mEq/L (ref 19–32)
Calcium: 9.8 mg/dL (ref 8.4–10.5)
Chloride: 105 mEq/L (ref 96–112)
Creatinine, Ser: 0.73 mg/dL (ref 0.40–1.20)
GFR: 87.28 mL/min (ref >60.00)
Glucose, Bld: 88 mg/dL (ref 70–99)
Potassium: 4 mEq/L (ref 3.5–5.1)
Sodium: 141 mEq/L (ref 135–145)
Total Bilirubin: 0.8 mg/dL (ref 0.2–1.2)
Total Protein: 6.5 g/dL (ref 6.0–8.3)

## 2020-08-18 ENCOUNTER — Ambulatory Visit (INDEPENDENT_AMBULATORY_CARE_PROVIDER_SITE_OTHER): Admitting: Family Medicine

## 2020-08-18 ENCOUNTER — Other Ambulatory Visit: Payer: Self-pay

## 2020-08-18 ENCOUNTER — Encounter: Payer: Self-pay | Admitting: Family Medicine

## 2020-08-18 VITALS — BP 138/75 | HR 74 | Temp 96.9°F | Ht 63.25 in | Wt 195.6 lb

## 2020-08-18 DIAGNOSIS — Z0000 Encounter for general adult medical examination without abnormal findings: Secondary | ICD-10-CM | POA: Diagnosis not present

## 2020-08-18 DIAGNOSIS — I10 Essential (primary) hypertension: Secondary | ICD-10-CM | POA: Diagnosis not present

## 2020-08-18 DIAGNOSIS — Z1211 Encounter for screening for malignant neoplasm of colon: Secondary | ICD-10-CM

## 2020-08-18 DIAGNOSIS — E669 Obesity, unspecified: Secondary | ICD-10-CM

## 2020-08-18 DIAGNOSIS — E7800 Pure hypercholesterolemia, unspecified: Secondary | ICD-10-CM | POA: Diagnosis not present

## 2020-08-18 MED ORDER — ROSUVASTATIN CALCIUM 5 MG PO TABS: 5.0000 mg | ORAL_TABLET | Freq: Every day | ORAL | 3 refills | Status: DC

## 2020-08-18 MED ORDER — HYDROCHLOROTHIAZIDE 25 MG PO TABS: ORAL_TABLET | ORAL | 3 refills | Status: DC

## 2020-08-18 NOTE — Progress Notes (Signed)
Subjective:    Patient ID: Teresa Stephens, female    DOB: [birth year], 65 y.o.   MRN: [id number]  This visit occurred during the SARS-CoV-2 public health emergency.  Safety protocols were in place, including screening questions prior to the visit, additional usage of staff PPE, and extensive cleaning of exam room while observing appropriate contact time as indicated for disinfecting solutions.    HPI Here for health maintenance exam and to review chronic medical problems    Wt Readings from Last 3 Encounters:  08/18/20 195 lb 9 oz (88.7 kg)  03/18/19 190 lb 4 oz (86.3 kg)  02/12/18 178 lb 8 oz (81 kg)   34.37 kg/m  Pt had 2 covid vaccines  Tdap 4/14 Declines flu shot Zoster status -not vaccinated   Mammogram 3/21 -has reminder Self breast exam= no lumps   Colon cancer screening , declines colonoscopy  ifob done 11/20 Wants to do ifob kit again   Pap 7/18 neg with neg HPV Wants to wait a year on her pap  No gyn problems or symptoms   HTN bp is stable today  No cp or palpitations or headaches or edema  No side effects to medicines  BP Readings from Last 3 Encounters:  08/18/20 138/75  03/18/19 135/80  02/12/18 124/80    Takes hctz 25 mg daily   Weight is up, stopped walking , now she plans to get back to it  Pulse Readings from Last 3 Encounters:  08/18/20 74  03/18/19 77  02/12/18 72   Hyperlipidemia  Lab Results  Component Value Date   CHOL 205 (H) 08/12/2020   CHOL 218 (H) 03/05/2019   CHOL 212 (H) 02/12/2018   Lab Results  Component Value Date   HDL 51.80 08/12/2020   HDL 56.00 03/05/2019   HDL 64.30 02/12/2018   Lab Results  Component Value Date   LDLCALC 140 (H) 08/12/2020   LDLCALC 149 (H) 03/05/2019   LDLCALC 139 (H) 02/12/2018   Lab Results  Component Value Date   TRIG 63.0 08/12/2020   TRIG 65.0 03/05/2019   TRIG 45.0 02/12/2018   Lab Results  Component Value Date   CHOLHDL 4 08/12/2020   CHOLHDL 4 03/05/2019   CHOLHDL 3  02/12/2018   Lab Results  Component Value Date   LDLDIRECT 125.2 11/19/2009   diet controlled  Sometimes eats greasy foods  Has cut back on shrimp   Mother had CAD in 24s   Patient Active Problem List   Diagnosis Date Noted  . Obesity (BMI 30-39.9) 02/12/2018  . Encounter for screening mammogram for breast cancer 11/29/2016  . Hyperlipidemia 11/29/2016  . Colon cancer screening 08/21/2012  . Encounter for routine gynecological examination 08/21/2012  . Other screening mammogram 04/12/2011  . Routine general medical examination at a health care facility 03/07/2011  . SCIATICA, LEFT 06/02/2010  . LUMBAR RADICULOPATHY, LEFT 06/02/2010  . NONSPEC ELEVATION OF LEVELS OF TRANSAMINASE/LDH 04/24/2007  . Essential hypertension 01/01/2007  . ACID REFLUX DISEASE 01/01/2007   Past Medical History:  Diagnosis Date  . ASCUS (atypical squamous cells of undetermined significance) on Pap smear 1997   ok on repeat (10/98)  . GERD (gastroesophageal reflux disease)   . Helicobacter pylori infection    past history  . HTN (hypertension)    History reviewed. No pertinent surgical history. Social History   Tobacco Use  . Smoking status: Never Smoker  . Smokeless tobacco: Never Used  Substance Use Topics  . Alcohol use: Yes  Alcohol/week: 0.0 standard drinks    Comment: Occasional wine  . Drug use: No   Family History  Problem Relation Age of Onset  . Kidney disease Father   . Hypertension Father   . Diabetes Father   . Heart attack Mother   . Hypertension Mother   . Lung cancer Other        uncle  . Breast cancer Cousin 48   Allergies  Allergen Reactions  . Cephalexin     REACTION: GI  . Nizatidine     REACTION: nausea  . Wasp Venom     Local reaction   Current Outpatient Medications on File Prior to Visit  Medication Sig Dispense Refill  . ibuprofen (ADVIL,MOTRIN) 200 MG tablet Take 200 mg by mouth every 6 (six) hours as needed.    . meloxicam (MOBIC) 7.5 MG tablet  Take 1 tablet by mouth daily.     No current facility-administered medications on file prior to visit.    Review of Systems  Constitutional: Negative for activity change, appetite change, fatigue, fever and unexpected weight change.  HENT: Negative for congestion, ear pain, rhinorrhea, sinus pressure and sore throat.   Eyes: Negative for pain, redness and visual disturbance.  Respiratory: Negative for cough, shortness of breath and wheezing.   Cardiovascular: Negative for chest pain and palpitations.       Less exercise tolerant but no cp   Gastrointestinal: Negative for abdominal pain, blood in stool, constipation and diarrhea.  Endocrine: Negative for polydipsia and polyuria.  Genitourinary: Negative for dysuria, frequency and urgency.  Musculoskeletal: Negative for arthralgias, back pain and myalgias.  Skin: Negative for pallor and rash.  Allergic/Immunologic: Negative for environmental allergies.  Neurological: Negative for dizziness, syncope and headaches.  Hematological: Negative for adenopathy. Does not bruise/bleed easily.  Psychiatric/Behavioral: Negative for decreased concentration and dysphoric mood. The patient is not nervous/anxious.        Objective:   Physical Exam Constitutional:      General: She is not in acute distress.    Appearance: Normal appearance. She is well-developed. She is obese. She is not ill-appearing or diaphoretic.  HENT:     Head: Normocephalic and atraumatic.     Right Ear: Tympanic membrane, ear canal and external ear normal.     Left Ear: Tympanic membrane, ear canal and external ear normal.     Nose: Nose normal. No congestion.     Mouth/Throat:     Mouth: Mucous membranes are moist.     Pharynx: Oropharynx is clear. No posterior oropharyngeal erythema.  Eyes:     General: No scleral icterus.    Extraocular Movements: Extraocular movements intact.     Conjunctiva/sclera: Conjunctivae normal.     Pupils: Pupils are equal, round, and  reactive to light.  Neck:     Thyroid: No thyromegaly.     Vascular: No carotid bruit or JVD.  Cardiovascular:     Rate and Rhythm: Normal rate and regular rhythm.     Pulses: Normal pulses.     Heart sounds: Normal heart sounds. No gallop.   Pulmonary:     Effort: Pulmonary effort is normal. No respiratory distress.     Breath sounds: Normal breath sounds. No wheezing.     Comments: Good air exch Chest:     Chest wall: No tenderness.  Abdominal:     General: Bowel sounds are normal. There is no distension or abdominal bruit.     Palpations: Abdomen is soft. There is no  mass.     Tenderness: There is no abdominal tenderness.     Hernia: No hernia is present.  Genitourinary:    Comments: Breast exam: No mass, nodules, thickening, tenderness, bulging, retraction, inflamation, nipple discharge or skin changes noted.  No axillary or clavicular LA.     Musculoskeletal:        General: No tenderness. Normal range of motion.     Cervical back: Normal range of motion and neck supple. No rigidity. No muscular tenderness.     Right lower leg: No edema.     Left lower leg: No edema.     Comments: No kyphosis   Lymphadenopathy:     Cervical: No cervical adenopathy.  Skin:    General: Skin is warm and dry.     Coloration: Skin is not pale.     Findings: No erythema or rash.  Neurological:     Mental Status: She is alert. Mental status is at baseline.     Cranial Nerves: No cranial nerve deficit.     Motor: No abnormal muscle tone.     Coordination: Coordination normal.     Gait: Gait normal.     Deep Tendon Reflexes: Reflexes are normal and symmetric. Reflexes normal.  Psychiatric:        Mood and Affect: Mood normal.     Comments: Mood is good           Assessment & Plan:   Problem List Items Addressed This Visit      Cardiovascular and Mediastinum   Essential hypertension    bp in fair control at this time  BP Readings from Last 1 Encounters:  08/18/20 138/75   No  changes needed Most recent labs reviewed  Disc lifstyle change with low sodium diet and exercise  Plan to continue hctz 25 mg daily      Relevant Medications   rosuvastatin (CRESTOR) 5 MG tablet   hydrochlorothiazide (HYDRODIURIL) 25 MG tablet     Other   Routine general medical examination at a health care facility - Primary    Reviewed health habits including diet and exercise and skin cancer prevention Reviewed appropriate screening tests for age  Also reviewed health mt list, fam hx and immunization status , as well as social and family history   See HPI Labs reviewed  Declines covid booster due to side eff Declines colonoscopy so ifob kit given  Will plan pap in a year  Pt will schedule mammogram next month  She is interested in the shingrix vaccine if it is covered      Colon cancer screening    Pt declines colonoscopy  ifob kit ordered      Relevant Orders   Fecal occult blood, imunochemical   Hyperlipidemia    Disc goals for lipids and reasons to control them Rev last labs with pt Rev low sat fat diet in detail Mother had early CAD Will try crestor 5 mg daily  Re check lab in 4 wk      Relevant Medications   rosuvastatin (CRESTOR) 5 MG tablet   hydrochlorothiazide (HYDRODIURIL) 25 MG tablet   Other Relevant Orders   Lipid panel   ALT   AST   Obesity (BMI 30-39.9)    Discussed how this problem influences overall health and the risks it imposes  Reviewed plan for weight loss with lower calorie diet (via better food choices and also portion control or program like weight watchers) and exercise building up to or  more than 30 minutes 5 days per week including some aerobic activity   Disc low glycemic diet  Plans to start walking and/or get to gym This would help mood as well

## 2020-08-18 NOTE — Patient Instructions (Addendum)
If you are interested in the shingles vaccine series (Shingrix), call your insurance or pharmacy to check on coverage and location it must be given.  If affordable - you can schedule it here or at your pharmacy depending on coverage   Make sure to schedule your mammogram   Do the stool kit for colon cancer screening  Get back to regular exercise daily  Try to get most of your carbohydrates from produce (with the exception of white potatoes)  Eat less bread/pasta/Rufino/snack foods/cereals/sweets and other items from the middle of the grocery store (processed carbs)  For cholesterol  Avoid red meat/ fried foods/ egg yolks/ fatty breakfast meats/ butter, cheese and high fat dairy/ and shellfish    Start rosuvastatin for cholesterol 5 mg daily in evening  If any side effects stop it and let me know  We will check labs in 4 weeks- stop up front to schedule that

## 2020-08-18 NOTE — Assessment & Plan Note (Signed)
Pt declines colonoscopy  ifob kit ordered 

## 2020-08-18 NOTE — Assessment & Plan Note (Signed)
Discussed how this problem influences overall health and the risks it imposes  Reviewed plan for weight loss with lower calorie diet (via better food choices and also portion control or program like weight watchers) and exercise building up to or more than 30 minutes 5 days per week including some aerobic activity   Disc low glycemic diet  Plans to start walking and/or get to gym This would help mood as well

## 2020-08-18 NOTE — Assessment & Plan Note (Addendum)
Reviewed health habits including diet and exercise and skin cancer prevention Reviewed appropriate screening tests for age  Also reviewed health mt list, fam hx and immunization status , as well as social and family history   See HPI Labs reviewed  Declines covid booster due to side eff Declines colonoscopy so ifob kit given  Will plan pap in a year  Pt will schedule mammogram next month  She is interested in the shingrix vaccine if it is covered

## 2020-08-18 NOTE — Assessment & Plan Note (Signed)
Disc goals for lipids and reasons to control them Rev last labs with pt Rev low sat fat diet in detail Mother had early CAD Will try crestor 5 mg daily  Re check lab in 4 wk

## 2020-08-18 NOTE — Assessment & Plan Note (Signed)
bp in fair control at this time  BP Readings from Last 1 Encounters:  08/18/20 138/75   No changes needed Most recent labs reviewed  Disc lifstyle change with low sodium diet and exercise  Plan to continue hctz 25 mg daily

## 2020-08-25 ENCOUNTER — Other Ambulatory Visit (INDEPENDENT_AMBULATORY_CARE_PROVIDER_SITE_OTHER)

## 2020-08-25 DIAGNOSIS — Z1211 Encounter for screening for malignant neoplasm of colon: Secondary | ICD-10-CM

## 2020-08-25 LAB — FECAL OCCULT BLOOD, IMMUNOCHEMICAL: Fecal Occult Bld: NEGATIVE

## 2020-08-26 ENCOUNTER — Encounter: Payer: Self-pay | Admitting: *Deleted

## 2020-10-30 ENCOUNTER — Telehealth: Payer: Self-pay | Admitting: *Deleted

## 2020-10-30 NOTE — Telephone Encounter (Signed)
PLEASE NOTE: All timestamps contained within this report are represented as [place] Standard Time. CONFIDENTIALTY NOTICE: This fax transmission is intended only for the addressee. It contains information that is legally privileged, confidential or otherwise protected from use or disclosure. If you are not the intended recipient, you are strictly prohibited from reviewing, disclosing, copying using or disseminating any of this information or taking any action in reliance on or regarding this information. If you have received this fax in error, please notify us immediately by telephone so that we can arrange for its return to [place]. Phone: [phone], Toll-Free: [phone], Fax: [phone] Page: 1 of 2 Call Id: [id number] [facility] Day - Client TELEPHONE ADVICE RECORD AccessNurse Patient Name: Teresa Stephens CE Gender: Female DOB: 1955/11/03 Age: 65 Y 13 D Return Phone Number: [phone] (Primary) Address: City/ State/ Zip: [place]  [address] Client [facility] Day - Client Client Site [facility] - Day Physician [place], Idamae Schuller - MD Contact Type Call Who Is Calling Patient / Member / Family / Caregiver Call Type Triage / Clinical Relationship To Patient Self Return Phone Number [phone] (Primary) Chief Complaint Cough Reason for Call Symptomatic / Request for Health Information Initial Comment Caller states she has a cough with congestion. Translation No Nurse Assessment Nurse: Jetty Peeks, RN, Lillia Abed Date/Time (Eastern Time): 10/30/2020 9:07:57 AM Confirm and document reason for call. If symptomatic, describe symptoms. ---Caller states she has a cough and light congestion. No fever. Does the patient have any new or worsening symptoms? ---Yes Will a triage be completed? ---Yes Related visit to physician within the last 2 weeks? ---Yes Does the PT have any chronic conditions? (i.e. diabetes,  asthma, this includes High risk factors for pregnancy, etc.) ---Yes List chronic conditions. ---HTN Is this a behavioral health or substance abuse call? ---No Guidelines Guideline Title Affirmed Question Affirmed Notes Nurse Date/Time (Eastern Time) COVID-19 - Diagnosed or Suspected [1] Continuous (nonstop) coughing interferes with work or school AND [2] no improvement using cough treatment per Care Advice Weiss-Hilton, RN, Lillia Abed 10/30/2020 9:12:05 AM Disp. Time Lamount Cohen Time) Disposition Final User 10/30/2020 9:18:33 AM Call PCP within 24 Hours Yes Weiss-Hilton, RN, Lillia Abed PLEASE NOTE: All timestamps contained within this report are represented as [place] Standard Time. CONFIDENTIALTY NOTICE: This fax transmission is intended only for the addressee. It contains information that is legally privileged, confidential or otherwise protected from use or disclosure. If you are not the intended recipient, you are strictly prohibited from reviewing, disclosing, copying using or disseminating any of this information or taking any action in reliance on or regarding this information. If you have received this fax in error, please notify us immediately by telephone so that we can arrange for its return to [place]. Phone: [phone], Toll-Free: [phone], Fax: [phone] Page: 2 of 2 Call Id: [id number] Caller Disagree/Comply Comply Caller Understands Yes PreDisposition InappropriateToAsk Care Advice Given Per Guideline CALL PCP WITHIN 24 HOURS: * You need to discuss this with your doctor (or NP/PA) within the next 24 hours. * Cough: Use cough drops. * Feeling dehydrated: Drink extra liquids. If the air in your home is dry, use a humidifier. COUGH MEDICINES: * COUGH SYRUP WITH DEXTROMETHORPHAN: An over-the-counter cough syrup can help your cough. The most common cough suppressant in over-the-counter cough medicines is dextromethorphan. COUGH SYRUP WITH DEXTROMETHORPHAN - EXTRA NOTES AND  WARNINGS: * Do not try to completely stop coughs that produce mucus and phlegm. * Coughing is helpful. It brings up the  mucus from the lungs and helps prevent pneumonia. CALL BACK IF: * Fever over 103 F (39.4 C) * Chest pain or difficulty breathing occurs * You become worse CARE ADVICE given per COVID-19 - DIAGNOSED OR SUSPECTED (Adult) guideline. Comments User: Jovita Kussmaul, RN Date/Time Lamount Cohen Time): 10/30/2020 9:10:20 AM Was seen Monday at [facility] for cough and congestion and was rx'd an abx. Finished it yesterday. User: Jovita Kussmaul, RN Date/Time Lamount Cohen Time): 10/30/2020 9:11:18 AM Rx'd azithromycin on Monday by UC. User: Jovita Kussmaul, RN Date/Time Lamount Cohen Time): 10/30/2020 9:12:24 AM Rapid tested negative for covid at [facility] on Monday. User: Jovita Kussmaul, RN Date/Time Lamount Cohen Time): 10/30/2020 9:15:28 AM States sx are the same from when she was seen on Monday- no new or worsening sx. States sx are improved from when seen on Monday bc she isn't as congested and she feels "a whole lot better" User: Jovita Kussmaul, RN Date/Time Lamount Cohen Time): 10/30/2020 9:19:24 AM RN advised caller per client directives, "call the office - patient should let office know they spoke with nurse and need to schedule appointment." Caller voiced understanding. Referrals REFERRED TO PCP OFFICE

## 2020-10-30 NOTE — Telephone Encounter (Signed)
PLEASE NOTE: All timestamps contained within this report are represented as [place] Standard Time. CONFIDENTIALTY NOTICE: This fax transmission is intended only for the addressee. It contains information that is legally privileged, confidential or otherwise protected from use or disclosure. If you are not the intended recipient, you are strictly prohibited from reviewing, disclosing, copying using or disseminating any of this information or taking any action in reliance on or regarding this information. If you have received this fax in error, please notify us immediately by telephone so that we can arrange for its return to [place]. Phone: [phone], Toll-Free: [phone], Fax: [phone] Page: 1 of 2 Call Id: [id number] [facility] Night - Client TELEPHONE ADVICE RECORD AccessNurse Patient Name: Teresa Stephens Gender: Female DOB: [birth year] Age: 65 Y 12 D Return Phone Number: [phone] (Primary) Address: City/ State/ Zip: [place]  [address] Client [facility] Night - Client Client Site [facility] - Night Physician AA - PHYSICIAN, NOT LISTED- MD Contact Type Call Who Is Calling Patient / Member / Family / Caregiver Call Type Triage / Clinical Relationship To Patient Self Return Phone Number [phone] (Primary) Chief Complaint Cough Reason for Call Request to Schedule Office Appointment Initial Comment Caller would like to make an appt. She has cough and congestion and she went to urgent care and they gave her medication but today is the last day and she is still feeling bad. Translation No Nurse Assessment Nurse: Elesa Hacker, RN, Nash Dimmer Date/Time (Eastern Time): 10/29/2020 5:30:03 PM Confirm and document reason for call. If symptomatic, describe symptoms. ---Caller would like to make an appt. She has cough and congestion and she went to urgent care and they gave her medication but today is the last day  and she is still feeling bad. Negative Covid test. Caller was given ABX on Monday. A z pack. Ear pain is gone. Congestion in her chest now. Does the patient have any new or worsening symptoms? ---Yes Will a triage be completed? ---Yes Related visit to physician within the last 2 weeks? ---Yes Does the PT have any chronic conditions? (i.e. diabetes, asthma, this includes High risk factors for pregnancy, etc.) ---Yes List chronic conditions. ---HTN Is this a behavioral health or substance abuse call? ---No Guidelines Guideline Title Affirmed Question Affirmed Notes Nurse Date/Time (Eastern Time) Infection on Antibiotic Follow-up Call [1] Caller has NONURGENT question AND [2] triager unable to answer question Elesa Hacker, RN, Nash Dimmer 10/29/2020 5:36:13 PM PLEASE NOTE: All timestamps contained within this report are represented as [place] Standard Time. CONFIDENTIALTY NOTICE: This fax transmission is intended only for the addressee. It contains information that is legally privileged, confidential or otherwise protected from use or disclosure. If you are not the intended recipient, you are strictly prohibited from reviewing, disclosing, copying using or disseminating any of this information or taking any action in reliance on or regarding this information. If you have received this fax in error, please notify us immediately by telephone so that we can arrange for its return to [place]. Phone: [phone], Toll-Free: [phone], Fax: [phone] Page: 2 of 2 Call Id: [id number] Disp. Time Lamount Cohen Time) Disposition Final User 10/29/2020 5:21:22 PM Attempt made - no message left Deaton, RN, Nash Dimmer 10/29/2020 5:38:33 PM Call PCP within 24 Hours Yes Deaton, RN, Cory Roughen Disagree/Comply Comply Caller Understands Yes PreDisposition Did not know what to do Care Advice Given Per Guideline CALL PCP WITHIN 24 HOURS: * You need to discuss this with your doctor (or NP/PA) within  the next 24 hours. * IF  OFFICE WILL BE OPEN: Call the office when it opens tomorrow morning. CALL BACK IF: * You have more questions or concerns * You become worse CARE ADVICE given per Infection on Antibiotics Follow-Up Call (Adult) guideline. Referrals REFERRED TO PCP OFFICE

## 2020-10-30 NOTE — Telephone Encounter (Signed)
Aware, will see her then 

## 2020-10-30 NOTE — Telephone Encounter (Signed)
Patient has a video visit scheduled with Dr. Milinda Antis 11/03/20 at 8:00. Spoke to patient by telephone and was advised that her symptoms started Sunday and she went to the UC on Monday. Patient stated that her covid test was negative Patient denies a fever, SOB or difficulty breathing. Patient was given ER precautions and verbalized understanding. Patient stated that she is feeling a little better. [facility] [address]

## 2020-11-03 ENCOUNTER — Encounter: Payer: Self-pay | Admitting: Family Medicine

## 2020-11-03 ENCOUNTER — Telehealth (INDEPENDENT_AMBULATORY_CARE_PROVIDER_SITE_OTHER): Admitting: Family Medicine

## 2020-11-03 ENCOUNTER — Other Ambulatory Visit: Payer: Self-pay

## 2020-11-03 DIAGNOSIS — J069 Acute upper respiratory infection, unspecified: Secondary | ICD-10-CM

## 2020-11-03 NOTE — Progress Notes (Signed)
Virtual Visit via Video Note  I connected with Teresa Stephens on 11/03/20 at  8:00 AM EDT by a video enabled telemedicine application and verified that I am speaking with the correct person using two identifiers.  Location: Patient: home Provider: office   I discussed the limitations of evaluation and management by telemedicine and the availability of in person appointments. The patient expressed understanding and agreed to proceed.  Parties involved in encounter  Patient: Teresa Stephens  Provider:  Roxy Manns MD   History of Present Illness: Pt presents with cough and congestion   Was around sick grandchild She got sick with cold and cough -started thursday Saturday she sprayed the back yard - that made her cough worse ond Sunday   Saw UC Sunday Neg covid test    Covid vaccinated   gave her zpak   Still some productive cough (improved but not gone)  Mucous is white/yellow  No wheezing or sob (was wheezing but that is better)  Some nasal congestion-improved /almost gone    No sinus pain or pressure   Taking otc Mucinex DM  Drinking lots of fluids   Patient Active Problem List   Diagnosis Date Noted   URI (upper respiratory infection) 11/03/2020   Obesity (BMI 30-39.9) 02/12/2018   Encounter for screening mammogram for breast cancer 11/29/2016   Hyperlipidemia 11/29/2016   Colon cancer screening 08/21/2012   Encounter for routine gynecological examination 08/21/2012   Other screening mammogram 04/12/2011   Routine general medical examination at a health care facility 03/07/2011   SCIATICA, LEFT 06/02/2010   LUMBAR RADICULOPATHY, LEFT 06/02/2010   NONSPEC ELEVATION OF LEVELS OF TRANSAMINASE/LDH 04/24/2007   Essential hypertension 01/01/2007   ACID REFLUX DISEASE 01/01/2007   Past Medical History:  Diagnosis Date   ASCUS (atypical squamous cells of undetermined significance) on Pap smear 1997   ok on repeat (10/98)   GERD (gastroesophageal reflux disease)     Helicobacter pylori infection    past history   HTN (hypertension)    History reviewed. No pertinent surgical history. Social History   Tobacco Use   Smoking status: Never   Smokeless tobacco: Never  Substance Use Topics   Alcohol use: Yes    Alcohol/week: 0.0 standard drinks    Comment: Occasional wine   Drug use: No   Family History  Problem Relation Age of Onset   Kidney disease Father    Hypertension Father    Diabetes Father    Heart attack Mother    Hypertension Mother    Lung cancer Other        uncle   Breast cancer Cousin 65   Allergies  Allergen Reactions   Cephalexin     REACTION: GI   Nizatidine     REACTION: nausea   Wasp Venom     Local reaction   Current Outpatient Medications on File Prior to Visit  Medication Sig Dispense Refill   hydrochlorothiazide (HYDRODIURIL) 25 MG tablet TAKE 1 TABLET BY MOUTH EVERY DAY 90 tablet 3   ibuprofen (ADVIL,MOTRIN) 200 MG tablet Take 200 mg by mouth every 6 (six) hours as needed.     meloxicam (MOBIC) 7.5 MG tablet Take 1 tablet by mouth daily.     rosuvastatin (CRESTOR) 5 MG tablet Take 1 tablet (5 mg total) by mouth daily. (Patient taking differently: Take 5 mg by mouth every other day.) 90 tablet 3   No current facility-administered medications on file prior to visit.   Review of Systems  Constitutional:  Negative for chills, fever and malaise/fatigue.  HENT:  Negative for congestion, ear pain, sinus pain and sore throat.   Eyes:  Negative for blurred vision, discharge and redness.  Respiratory:  Positive for cough and sputum production. Negative for shortness of breath, wheezing and stridor.   Cardiovascular:  Negative for chest pain, palpitations and leg swelling.  Gastrointestinal:  Negative for abdominal pain, diarrhea, nausea and vomiting.  Musculoskeletal:  Negative for myalgias.  Skin:  Negative for rash.  Neurological:  Negative for dizziness and headaches.   Observations/Objective: Patient appears  well, in no distress Weight is baseline  No facial swelling or asymmetry Normal voice-not hoarse and no slurred speech No obvious tremor or mobility impairment Moving neck and UEs normally Able to hear the call well  No wheeze or shortness of breath during interview  Occ junky sounding cough (mild) Talkative and mentally sharp with no cognitive changes No skin changes on face or neck , no rash or pallor Affect is normal    Assessment and Plan: Problem List Items Addressed This Visit       Respiratory   URI (upper respiratory infection)    Suspect viral ,however urgent care gave her a zpak so ? If suspected pna or sinusitis Feeling much better with only a lingering cough  Advised to continue mucinex dm with lots of fluids Adv to call if any symptoms worsen or if she develops wheezing or sob or fever  Reassuring-covid test was neg When feeling better-recommend the covid booster Update if not starting to improve in a week or if worsening           Follow Up Instructions: Drink fluids and rest  mucinex DM is good for cough and congestion  Nasal saline for congestion as needed  Tylenol for fever or pain or headache  Please alert [place] if symptoms worsen (if severe or short of breath please go to the ER)    I discussed the assessment and treatment plan with the patient. The patient was provided an opportunity to ask questions and all were answered. The patient agreed with the plan and demonstrated an understanding of the instructions.   The patient was advised to call back or seek an in-person evaluation if the symptoms worsen or if the condition fails to improve as anticipated.     Roxy Manns, MD

## 2020-11-03 NOTE — Patient Instructions (Addendum)
Drink fluids and rest  mucinex DM is good for cough and congestion  Nasal saline for congestion as needed  Tylenol for fever or pain or headache  Please alert [place] if symptoms worsen (if severe or short of breath please go to the ER)

## 2020-11-03 NOTE — Assessment & Plan Note (Signed)
Suspect viral ,however urgent care gave her a zpak so ? If suspected pna or sinusitis Feeling much better with only a lingering cough  Advised to continue mucinex dm with lots of fluids Adv to call if any symptoms worsen or if she develops wheezing or sob or fever  Reassuring-covid test was neg When feeling better-recommend the covid booster Update if not starting to improve in a week or if worsening

## 2021-08-29 ENCOUNTER — Other Ambulatory Visit: Payer: Self-pay | Admitting: Family Medicine

## 2021-11-02 ENCOUNTER — Telehealth: Payer: Self-pay | Admitting: Family Medicine

## 2021-11-02 ENCOUNTER — Other Ambulatory Visit: Payer: Self-pay | Admitting: Family Medicine

## 2021-11-02 DIAGNOSIS — Z1231 Encounter for screening mammogram for malignant neoplasm of breast: Secondary | ICD-10-CM

## 2021-11-02 NOTE — Telephone Encounter (Signed)
Left message for patient to call back and schedule lab appointment for 1 week prior to CPE

## 2021-11-10 ENCOUNTER — Telehealth: Payer: Self-pay | Admitting: Family Medicine

## 2021-11-10 DIAGNOSIS — E7800 Pure hypercholesterolemia, unspecified: Secondary | ICD-10-CM

## 2021-11-10 DIAGNOSIS — I10 Essential (primary) hypertension: Secondary | ICD-10-CM

## 2021-11-10 NOTE — Telephone Encounter (Signed)
-----   Message from Ilda Foil sent at 11/03/2021 10:35 AM EDT ----- Regarding: Labs for Friday June 23,2023 Labs for cpx.  Thank you

## 2021-11-12 ENCOUNTER — Other Ambulatory Visit (INDEPENDENT_AMBULATORY_CARE_PROVIDER_SITE_OTHER): Payer: Medicare Other

## 2021-11-12 DIAGNOSIS — I10 Essential (primary) hypertension: Secondary | ICD-10-CM

## 2021-11-12 DIAGNOSIS — E7800 Pure hypercholesterolemia, unspecified: Secondary | ICD-10-CM | POA: Diagnosis not present

## 2021-11-12 LAB — CBC WITH DIFFERENTIAL/PLATELET
Basophils Absolute: 0 10*3/uL (ref 0.0–0.1)
Basophils Relative: 0.6 % (ref 0.0–3.0)
Eosinophils Absolute: 0.1 10*3/uL (ref 0.0–0.7)
Eosinophils Relative: 2.3 % (ref 0.0–5.0)
HCT: 38.2 % (ref 36.0–46.0)
Hemoglobin: 12.9 g/dL (ref 12.0–15.0)
Lymphocytes Relative: 39.4 % (ref 12.0–46.0)
Lymphs Abs: 2.1 10*3/uL (ref 0.7–4.0)
MCHC: 33.7 g/dL (ref 30.0–36.0)
MCV: 91.5 fl (ref 78.0–100.0)
Monocytes Absolute: 0.5 10*3/uL (ref 0.1–1.0)
Monocytes Relative: 9.5 % (ref 3.0–12.0)
Neutro Abs: 2.6 10*3/uL (ref 1.4–7.7)
Neutrophils Relative %: 48.2 % (ref 43.0–77.0)
Platelets: 279 10*3/uL (ref 150.0–400.0)
RBC: 4.17 Mil/uL (ref 3.87–5.11)
RDW: 13.7 % (ref 11.5–15.5)
WBC: 5.3 10*3/uL (ref 4.0–10.5)

## 2021-11-12 LAB — LIPID PANEL
Cholesterol: 213 mg/dL — ABNORMAL HIGH (ref 0–200)
HDL: 55.9 mg/dL (ref >39.00)
LDL Cholesterol: 144 mg/dL — ABNORMAL HIGH (ref 0–99)
NonHDL: 156.67
Total CHOL/HDL Ratio: 4
Triglycerides: 61 mg/dL (ref 0.0–149.0)
VLDL: 12.2 mg/dL (ref 0.0–40.0)

## 2021-11-12 LAB — COMPREHENSIVE METABOLIC PANEL
ALT: 9 U/L (ref 0–35)
AST: 17 U/L (ref 0–37)
Albumin: 4 g/dL (ref 3.5–5.2)
Alkaline Phosphatase: 58 U/L (ref 39–117)
BUN: 14 mg/dL (ref 6–23)
CO2: 33 mEq/L — ABNORMAL HIGH (ref 19–32)
Calcium: 9.6 mg/dL (ref 8.4–10.5)
Chloride: 103 mEq/L (ref 96–112)
Creatinine, Ser: 0.75 mg/dL (ref 0.40–1.20)
GFR: 83.75 mL/min (ref >60.00)
Glucose, Bld: 86 mg/dL (ref 70–99)
Potassium: 3.7 mEq/L (ref 3.5–5.1)
Sodium: 142 mEq/L (ref 135–145)
Total Bilirubin: 0.9 mg/dL (ref 0.2–1.2)
Total Protein: 6.7 g/dL (ref 6.0–8.3)

## 2021-11-12 LAB — TSH: TSH: 3.13 u[IU]/mL (ref 0.35–5.50)

## 2021-11-17 ENCOUNTER — Ambulatory Visit (INDEPENDENT_AMBULATORY_CARE_PROVIDER_SITE_OTHER): Payer: Medicare Other | Admitting: Family Medicine

## 2021-11-17 ENCOUNTER — Encounter: Payer: Self-pay | Admitting: Family Medicine

## 2021-11-17 VITALS — BP 135/80 | HR 63 | Ht 63.0 in | Wt 191.6 lb

## 2021-11-17 DIAGNOSIS — E7800 Pure hypercholesterolemia, unspecified: Secondary | ICD-10-CM

## 2021-11-17 DIAGNOSIS — Z0000 Encounter for general adult medical examination without abnormal findings: Secondary | ICD-10-CM | POA: Diagnosis not present

## 2021-11-17 DIAGNOSIS — Z1211 Encounter for screening for malignant neoplasm of colon: Secondary | ICD-10-CM | POA: Diagnosis not present

## 2021-11-17 DIAGNOSIS — Z1231 Encounter for screening mammogram for malignant neoplasm of breast: Secondary | ICD-10-CM | POA: Diagnosis not present

## 2021-11-17 DIAGNOSIS — I10 Essential (primary) hypertension: Secondary | ICD-10-CM | POA: Diagnosis not present

## 2021-11-17 DIAGNOSIS — E2839 Other primary ovarian failure: Secondary | ICD-10-CM | POA: Insufficient documentation

## 2021-11-17 DIAGNOSIS — E669 Obesity, unspecified: Secondary | ICD-10-CM

## 2021-11-17 NOTE — Assessment & Plan Note (Signed)
Mammogram is overdue  Planned for mid July  Nl exam  Enc self breast exams

## 2021-11-17 NOTE — Assessment & Plan Note (Signed)
Discussed how this problem influences overall health and the risks it imposes  Reviewed plan for weight loss with lower calorie diet (via better food choices and also portion control or program like weight watchers) and exercise building up to or more than 30 minutes 5 days per week including some aerobic activity   Commended on walking  Enc to add resistance training

## 2021-11-17 NOTE — Assessment & Plan Note (Signed)
bp in fair control at this time  BP Readings from Last 1 Encounters:  11/17/21 135/80   No changes needed Most recent labs reviewed  Disc lifstyle change with low sodium diet and exercise  Plan to continue hctz 25 mg daily

## 2021-11-17 NOTE — Progress Notes (Signed)
Subjective:    Patient ID: Teresa Stephens, female    DOB: Jan 24, 1956, 66 y.o.   MRN: [id number]  HPI Here for health maintenance exam and to review chronic medical problems    Wt Readings from Last 3 Encounters:  11/17/21 191 lb 9.6 oz (86.9 kg)  08/18/20 195 lb 9 oz (88.7 kg)  03/18/19 190 lb 4 oz (86.3 kg)   33.94 kg/m  Not working  Doing well  Retired  Taking fair care of herself   Lost 4 lb , is working on it  Walking 2 miles    Immunization History  Administered Date(s) Administered   Influenza Split 04/12/2011   PFIZER(Purple Top)SARS-COV-2 Vaccination 09/13/2019, 10/08/2019   Td 10/25/2002   Tdap 08/21/2012   Does not get flu shots Declines further covid vaccine due to side eff Zoster status :  declines for now  Hesitant about pna vaccine, does not want it yet  Mammogram 07/2019 / has it planned July 14th  Self breast exam: no lumps  No changes   Pap 11/2016 nl with neg HPV  Is not sexually active right now  No gyn problems  Never had any abnormal paps  Does not want one    Colon cancer screening : neg ifob kit 08/2020 Declines colonoscopy for now Open to cologuard    Dexa : never had one Falls:none Fractures:none Supplements   none  Exercise walking   Vision Col[facility] Ewing -fine per pt Ears bother her/wax    HTN bp is stable today  No cp or palpitations or headaches or edema  No side effects to medicines  BP Readings from Last 3 Encounters:  11/17/21 135/80  08/18/20 138/75  03/18/19 135/80    Hctz 25 mg daily   Lab Results  Component Value Date   CREATININE 0.75 11/12/2021   BUN 14 11/12/2021   NA 142 11/12/2021   K 3.7 11/12/2021   CL 103 11/12/2021   CO2 33 (H) 11/12/2021    Hyperlipidemia  Lab Results  Component Value Date   CHOL 213 (H) 11/12/2021   CHOL 205 (H) 08/12/2020   CHOL 218 (H) 03/05/2019   Lab Results  Component Value Date   HDL 55.90 11/12/2021   HDL 51.80 08/12/2020   HDL 56.00 03/05/2019   Lab Results   Component Value Date   LDLCALC 144 (H) 11/12/2021   LDLCALC 140 (H) 08/12/2020   LDLCALC 149 (H) 03/05/2019   Lab Results  Component Value Date   TRIG 61.0 11/12/2021   TRIG 63.0 08/12/2020   TRIG 65.0 03/05/2019   Lab Results  Component Value Date   CHOLHDL 4 11/12/2021   CHOLHDL 4 08/12/2020   CHOLHDL 4 03/05/2019   Lab Results  Component Value Date   LDLDIRECT 125.2 11/19/2009   Tried crestor and stopped it  She had nausea with it  Mother had early CAD  Watching diet for 3-4 months  Uses olive oil  Eats some fried food  Some bacon  Some red meat  Eats cheese    The 10-year ASCVD risk score (Arnett DK, et al., 2019) is: 11.9%   Values used to calculate the score:     Age: 37 years     Sex: Female     Is Non-Hispanic African American: Yes     Diabetic: No     Tobacco smoker: No     Systolic Blood Pressure: 426 mmHg     Is BP treated: Yes     HDL  Cholesterol: 55.9 mg/dL     Total Cholesterol: 213 mg/dL   Other labs Results for orders placed or performed in visit on 11/12/21  CBC with Differential/Platelet  Result Value Ref Range   WBC 5.3 4.0 - 10.5 K/uL   RBC 4.17 3.87 - 5.11 Mil/uL   Hemoglobin 12.9 12.0 - 15.0 g/dL   HCT 38.2 36.0 - 46.0 %   MCV 91.5 78.0 - 100.0 fl   MCHC 33.7 30.0 - 36.0 g/dL   RDW 13.7 11.5 - 15.5 %   Platelets 279.0 150.0 - 400.0 K/uL   Neutrophils Relative % 48.2 43.0 - 77.0 %   Lymphocytes Relative 39.4 12.0 - 46.0 %   Monocytes Relative 9.5 3.0 - 12.0 %   Eosinophils Relative 2.3 0.0 - 5.0 %   Basophils Relative 0.6 0.0 - 3.0 %   Neutro Abs 2.6 1.4 - 7.7 K/uL   Lymphs Abs 2.1 0.7 - 4.0 K/uL   Monocytes Absolute 0.5 0.1 - 1.0 K/uL   Eosinophils Absolute 0.1 0.0 - 0.7 K/uL   Basophils Absolute 0.0 0.0 - 0.1 K/uL  Comprehensive metabolic panel  Result Value Ref Range   Sodium 142 135 - 145 mEq/L   Potassium 3.7 3.5 - 5.1 mEq/L   Chloride 103 96 - 112 mEq/L   CO2 33 (H) 19 - 32 mEq/L   Glucose, Bld 86 70 - 99 mg/dL    BUN 14 6 - 23 mg/dL   Creatinine, Ser 0.75 0.40 - 1.20 mg/dL   Total Bilirubin 0.9 0.2 - 1.2 mg/dL   Alkaline Phosphatase 58 39 - 117 U/L   AST 17 0 - 37 U/L   ALT 9 0 - 35 U/L   Total Protein 6.7 6.0 - 8.3 g/dL   Albumin 4.0 3.5 - 5.2 g/dL   GFR 83.75 >60.00 mL/min   Calcium 9.6 8.4 - 10.5 mg/dL  Lipid panel  Result Value Ref Range   Cholesterol 213 (H) 0 - 200 mg/dL   Triglycerides 61.0 0.0 - 149.0 mg/dL   HDL 55.90 >39.00 mg/dL   VLDL 12.2 0.0 - 40.0 mg/dL   LDL Cholesterol 144 (H) 0 - 99 mg/dL   Total CHOL/HDL Ratio 4    NonHDL 156.67   TSH  Result Value Ref Range   TSH 3.13 0.35 - 5.50 uIU/mL    Patient Active Problem List   Diagnosis Date Noted   Estrogen deficiency 11/17/2021   Obesity (BMI 30-39.9) 02/12/2018   Encounter for screening mammogram for breast cancer 11/29/2016   Hyperlipidemia 11/29/2016   Colon cancer screening 08/21/2012   Encounter for routine gynecological examination 08/21/2012   Other screening mammogram 04/12/2011   Routine general medical examination at a health care facility 03/07/2011   [place] ELEVATION OF LEVELS OF TRANSAMINASE/LDH 04/24/2007   Essential hypertension 01/01/2007   Past Medical History:  Diagnosis Date   ASCUS (atypical squamous cells of undetermined significance) on Pap smear 1997   ok on repeat (10/98)   GERD (gastroesophageal reflux disease)    Helicobacter pylori infection    past history   HTN (hypertension)    History reviewed. No pertinent surgical history. Social History   Tobacco Use   Smoking status: Never   Smokeless tobacco: Never  Substance Use Topics   Alcohol use: Yes    Alcohol/week: 0.0 standard drinks of alcohol    Comment: Occasional wine   Drug use: No   Family History  Problem Relation Age of Onset   Kidney disease Father  Hypertension Father    Diabetes Father    Heart attack Mother    Hypertension Mother    Lung cancer Other        uncle   Breast cancer Cousin 66   Allergies   Allergen Reactions   Cephalexin     REACTION: GI   Crestor [Rosuvastatin]     Nausea    Nizatidine     REACTION: nausea   Wasp Venom     Local reaction   Current Outpatient Medications on File Prior to Visit  Medication Sig Dispense Refill   hydrochlorothiazide (HYDRODIURIL) 25 MG tablet TAKE 1 TABLET BY MOUTH EVERY DAY 90 tablet 3   ibuprofen (ADVIL,MOTRIN) 200 MG tablet Take 200 mg by mouth every 6 (six) hours as needed.     No current facility-administered medications on file prior to visit.     Review of Systems  Constitutional:  Negative for activity change, appetite change, fatigue, fever and unexpected weight change.  HENT:  Negative for congestion, ear pain, rhinorrhea, sinus pressure and sore throat.   Eyes:  Negative for pain, redness and visual disturbance.  Respiratory:  Negative for cough, shortness of breath and wheezing.   Cardiovascular:  Negative for chest pain and palpitations.  Gastrointestinal:  Negative for abdominal pain, blood in stool, constipation and diarrhea.  Endocrine: Negative for polydipsia and polyuria.  Genitourinary:  Negative for dysuria, frequency and urgency.  Musculoskeletal:  Positive for arthralgias. Negative for back pain and myalgias.  Skin:  Negative for pallor and rash.  Allergic/Immunologic: Negative for environmental allergies.  Neurological:  Negative for dizziness, syncope and headaches.  Hematological:  Negative for adenopathy. Does not bruise/bleed easily.  Psychiatric/Behavioral:  Negative for decreased concentration and dysphoric mood. The patient is not nervous/anxious.        Objective:   Physical Exam Constitutional:      General: She is not in acute distress.    Appearance: Normal appearance. She is well-developed. She is obese. She is not ill-appearing or diaphoretic.  HENT:     Head: Normocephalic and atraumatic.     Right Ear: Tympanic membrane, ear canal and external ear normal.     Left Ear: Tympanic membrane,  ear canal and external ear normal.     Nose: Nose normal. No congestion.     Mouth/Throat:     Mouth: Mucous membranes are moist.     Pharynx: Oropharynx is clear. No posterior oropharyngeal erythema.  Eyes:     General: No scleral icterus.    Extraocular Movements: Extraocular movements intact.     Conjunctiva/sclera: Conjunctivae normal.     Pupils: Pupils are equal, round, and reactive to light.     Comments: Baseline strabismus   Neck:     Thyroid: No thyromegaly.     Vascular: No carotid bruit or JVD.  Cardiovascular:     Rate and Rhythm: Normal rate and regular rhythm.     Pulses: Normal pulses.     Heart sounds: Normal heart sounds.     No gallop.  Pulmonary:     Effort: Pulmonary effort is normal. No respiratory distress.     Breath sounds: Normal breath sounds. No wheezing.     Comments: Good air exch Chest:     Chest wall: No tenderness.  Abdominal:     General: Bowel sounds are normal. There is no distension or abdominal bruit.     Palpations: Abdomen is soft. There is no mass.     Tenderness: There is  no abdominal tenderness.     Hernia: No hernia is present.  Genitourinary:    Comments: Breast exam: No mass, nodules, thickening, tenderness, bulging, retraction, inflamation, nipple discharge or skin changes noted.  No axillary or clavicular LA.     Musculoskeletal:        General: No tenderness. Normal range of motion.     Cervical back: Normal range of motion and neck supple. No rigidity. No muscular tenderness.     Right lower leg: No edema.     Left lower leg: No edema.     Comments: No kyphosis   Lymphadenopathy:     Cervical: No cervical adenopathy.  Skin:    General: Skin is warm and dry.     Coloration: Skin is not pale.     Findings: No erythema or rash.     Comments: Few skin tags     Neurological:     Mental Status: She is alert. Mental status is at baseline.     Cranial Nerves: No cranial nerve deficit.     Motor: No abnormal muscle tone.      Coordination: Coordination normal.     Gait: Gait normal.     Deep Tendon Reflexes: Reflexes are normal and symmetric. Reflexes normal.  Psychiatric:        Mood and Affect: Mood normal.        Cognition and Memory: Cognition and memory normal.     Comments: Pleasant            Assessment & Plan:   Problem List Items Addressed This Visit       Cardiovascular and Mediastinum   Essential hypertension    bp in fair control at this time  BP Readings from Last 1 Encounters:  11/17/21 135/80  No changes needed Most recent labs reviewed  Disc lifstyle change with low sodium diet and exercise  Plan to continue hctz 25 mg daily         Other   Colon cancer screening    Pt declines colonoscopy  Discussed options for screening  Chooses cologuard Ordered- inst to call if she does not hear       Relevant Orders   Cologuard   Encounter for screening mammogram for breast cancer    Mammogram is overdue  Planned for mid July  Nl exam  Enc self breast exams      Estrogen deficiency    Referred for first dexa Discussed bone health   Disc need for calcium/ vitamin D/ wt bearing exercise and bone density test every 2 y to monitor Disc safety/ fracture risk in detail        Relevant Orders   DG Bone Density   Hyperlipidemia    Disc goals for lipids and reasons to control them Rev last labs with pt Rev low sat fat diet in detail  Pt stopped crestor , may have caused nausea  Enc her to try with food  She declined, wants to try watching diet first  inst to  Eat less fried food/red meat Re check 3-6 months       Obesity (BMI 30-39.9)    Discussed how this problem influences overall health and the risks it imposes  Reviewed plan for weight loss with lower calorie diet (via better food choices and also portion control or program like weight watchers) and exercise building up to or more than 30 minutes 5 days per week including some aerobic activity   Commended on  walking  Enc to add resistance training      Routine general medical examination at a health care facility - Primary    Reviewed health habits including diet and exercise and skin cancer prevention Reviewed appropriate screening tests for age  Also reviewed health mt list, fam hx and immunization status , as well as social and family history   See HPI Labs reviewed  Mammogram planned next mo  Declines flu shot Declines shingrix vaccine  Declines pap/gyn exam Declines colonsocopy (cologuard ordered) dexa ordered  No falls/fx  Encouraged ca and vit D and exercise

## 2021-11-17 NOTE — Assessment & Plan Note (Signed)
Disc goals for lipids and reasons to control them Rev last labs with pt Rev low sat fat diet in detail  Pt stopped crestor , may have caused nausea  Enc her to try with food  She declined, wants to try watching diet first  inst to  Eat less fried food/red meat Re check 3-6 months

## 2021-11-17 NOTE — Assessment & Plan Note (Addendum)
Reviewed health habits including diet and exercise and skin cancer prevention Reviewed appropriate screening tests for age  Also reviewed health mt list, fam hx and immunization status , as well as social and family history   See HPI Labs reviewed  Mammogram planned next mo  Declines flu shot Declines shingrix vaccine  Hesitant about pna vaccine -will discuss next time  declines pap/gyn exam Declines colonsocopy (cologuard ordered) dexa ordered  No falls/fx  Encouraged ca and vit D and exercise

## 2021-11-17 NOTE — Assessment & Plan Note (Signed)
Pt declines colonoscopy  Discussed options for screening  Chooses cologuard Ordered- inst to call if she does not hear

## 2021-11-17 NOTE — Assessment & Plan Note (Signed)
Referred for first dexa Discussed bone health   Disc need for calcium/ vitamin D/ wt bearing exercise and bone density test every 2 y to monitor Disc safety/ fracture risk in detail

## 2021-11-17 NOTE — Patient Instructions (Addendum)
Keep walking  Think about adding some weights or resistance bands   If you are interested in the new shingles vaccine (Shingrix) - call your local pharmacy to check on coverage and availability  If affordable, get on a wait list at your pharmacy to get the vaccine.   I will order cologuard test  If you don't hear from them in 2 weeks let us know   Let's re check cholesterol in 3-6 months  Avoid red meat/ fried foods/ egg yolks/ fatty breakfast meats/ butter, cheese and high fat dairy/ and shellfish    To keep your bones healthy  Try to get 1200-1500 mg of calcium per day with at least 1000 iu of vitamin D - for bone health If calcium constipates you then take vitamin D   Schedule your bone density test at [facility]  Please call the location of your choice from the menu below to schedule your Mammogram and/or Bone Density appointment.    [facility]   [facility]                      Phone:  [phone] [address]                               [place], [place] [address]                                                             Services: Traditional and 3D Mammogram, Bone Density   [facility]                 Phone: [phone] [address]                                                       [place], [place] [address]    Service: Bone Density ONLY   *this site does NOT perform mammograms  [facility]                        Phone:  [phone] [address]                                  [place], [place] [address]                                            Services:  3D Mammogram and Bone Density    [facility]   Phone:  [phone]   [address]  [place], [place] [address]                                            Services: 3D Mammogram  and Bone Density  [facility])  Phone:  [phone]   [address]                        [place], [place] [address]                                              Services:  3D Mammogram and Bone Density

## 2021-12-01 ENCOUNTER — Ambulatory Visit
Admission: RE | Admit: 2021-12-01 | Discharge: 2021-12-01 | Disposition: A | Discharge status: Home / Self Care | Payer: Medicare Other | Source: Ambulatory Visit | Attending: Family Medicine | Admitting: Family Medicine

## 2021-12-01 DIAGNOSIS — Z1231 Encounter for screening mammogram for malignant neoplasm of breast: Secondary | ICD-10-CM | POA: Insufficient documentation

## 2021-12-08 LAB — COLOGUARD: COLOGUARD: NEGATIVE

## 2022-01-27 ENCOUNTER — Other Ambulatory Visit

## 2022-09-02 ENCOUNTER — Other Ambulatory Visit: Payer: Self-pay | Admitting: Family Medicine

## 2022-11-25 ENCOUNTER — Ambulatory Visit: Payer: Medicare Other | Admitting: Family Medicine

## 2022-12-01 ENCOUNTER — Other Ambulatory Visit: Payer: Self-pay | Admitting: Family Medicine

## 2022-12-02 NOTE — Telephone Encounter (Signed)
Pt is overdue for her CPE (labs prior if possible), please schedule and then route back to me

## 2022-12-02 NOTE — Telephone Encounter (Signed)
Patient has been scheduled

## 2022-12-21 ENCOUNTER — Encounter (INDEPENDENT_AMBULATORY_CARE_PROVIDER_SITE_OTHER): Payer: Self-pay

## 2023-01-05 ENCOUNTER — Encounter (INDEPENDENT_AMBULATORY_CARE_PROVIDER_SITE_OTHER): Payer: Self-pay

## 2023-01-09 ENCOUNTER — Telehealth: Payer: Self-pay | Admitting: Family Medicine

## 2023-01-09 DIAGNOSIS — Z131 Encounter for screening for diabetes mellitus: Secondary | ICD-10-CM | POA: Insufficient documentation

## 2023-01-09 DIAGNOSIS — I10 Essential (primary) hypertension: Secondary | ICD-10-CM

## 2023-01-09 DIAGNOSIS — E7800 Pure hypercholesterolemia, unspecified: Secondary | ICD-10-CM

## 2023-01-09 NOTE — Telephone Encounter (Signed)
-----   Message from Alvina Chou sent at 12/26/2022 10:46 AM EDT ----- Regarding: lab orders for Tuesday, 8.20.24 Patient is scheduled for CPX labs, please order future labs, Thanks , Camelia Eng

## 2023-01-10 ENCOUNTER — Other Ambulatory Visit

## 2023-01-17 ENCOUNTER — Encounter: Admitting: Family Medicine

## 2023-01-27 ENCOUNTER — Other Ambulatory Visit (INDEPENDENT_AMBULATORY_CARE_PROVIDER_SITE_OTHER): Payer: Medicare Other

## 2023-01-27 DIAGNOSIS — E7800 Pure hypercholesterolemia, unspecified: Secondary | ICD-10-CM

## 2023-01-27 DIAGNOSIS — I10 Essential (primary) hypertension: Secondary | ICD-10-CM

## 2023-01-27 DIAGNOSIS — Z131 Encounter for screening for diabetes mellitus: Secondary | ICD-10-CM | POA: Diagnosis not present

## 2023-01-27 LAB — CBC WITH DIFFERENTIAL/PLATELET
Basophils Absolute: 0.1 10*3/uL (ref 0.0–0.1)
Basophils Relative: 1.3 % (ref 0.0–3.0)
Eosinophils Absolute: 0.2 10*3/uL (ref 0.0–0.7)
Eosinophils Relative: 3.2 % (ref 0.0–5.0)
HCT: 39.9 % (ref 36.0–46.0)
Hemoglobin: 13.2 g/dL (ref 12.0–15.0)
Lymphocytes Relative: 40.2 % (ref 12.0–46.0)
Lymphs Abs: 2.3 10*3/uL (ref 0.7–4.0)
MCHC: 33.1 g/dL (ref 30.0–36.0)
MCV: 91.8 fl (ref 78.0–100.0)
Monocytes Absolute: 0.5 10*3/uL (ref 0.1–1.0)
Monocytes Relative: 8.6 % (ref 3.0–12.0)
Neutro Abs: 2.6 10*3/uL (ref 1.4–7.7)
Neutrophils Relative %: 46.7 % (ref 43.0–77.0)
Platelets: 314 10*3/uL (ref 150.0–400.0)
RBC: 4.35 Mil/uL (ref 3.87–5.11)
RDW: 13.5 % (ref 11.5–15.5)
WBC: 5.6 10*3/uL (ref 4.0–10.5)

## 2023-01-27 LAB — LIPID PANEL
Cholesterol: 208 mg/dL — ABNORMAL HIGH (ref 0–200)
HDL: 55.9 mg/dL (ref >39.00)
LDL Cholesterol: 136 mg/dL — ABNORMAL HIGH (ref 0–99)
NonHDL: 152.01
Total CHOL/HDL Ratio: 4
Triglycerides: 80 mg/dL (ref 0.0–149.0)
VLDL: 16 mg/dL (ref 0.0–40.0)

## 2023-01-27 LAB — COMPREHENSIVE METABOLIC PANEL
ALT: 10 U/L (ref 0–35)
AST: 17 U/L (ref 0–37)
Albumin: 3.9 g/dL (ref 3.5–5.2)
Alkaline Phosphatase: 70 U/L (ref 39–117)
BUN: 17 mg/dL (ref 6–23)
CO2: 34 meq/L — ABNORMAL HIGH (ref 19–32)
Calcium: 9.8 mg/dL (ref 8.4–10.5)
Chloride: 101 meq/L (ref 96–112)
Creatinine, Ser: 0.81 mg/dL (ref 0.40–1.20)
GFR: 75.72 mL/min (ref >60.00)
Glucose, Bld: 82 mg/dL (ref 70–99)
Potassium: 3.7 meq/L (ref 3.5–5.1)
Sodium: 140 meq/L (ref 135–145)
Total Bilirubin: 1 mg/dL (ref 0.2–1.2)
Total Protein: 6.9 g/dL (ref 6.0–8.3)

## 2023-01-27 LAB — HEMOGLOBIN A1C: Hgb A1c MFr Bld: 5.5 % (ref 4.6–6.5)

## 2023-01-27 LAB — TSH: TSH: 4 u[IU]/mL (ref 0.35–5.50)

## 2023-02-01 ENCOUNTER — Encounter: Payer: Self-pay | Admitting: Family Medicine

## 2023-02-01 ENCOUNTER — Ambulatory Visit (INDEPENDENT_AMBULATORY_CARE_PROVIDER_SITE_OTHER): Payer: Medicare Other | Admitting: Family Medicine

## 2023-02-01 VITALS — BP 134/76 | HR 73 | Temp 98.1°F | Ht 63.0 in | Wt 201.2 lb

## 2023-02-01 DIAGNOSIS — E2839 Other primary ovarian failure: Secondary | ICD-10-CM

## 2023-02-01 DIAGNOSIS — I10 Essential (primary) hypertension: Secondary | ICD-10-CM

## 2023-02-01 DIAGNOSIS — E669 Obesity, unspecified: Secondary | ICD-10-CM

## 2023-02-01 DIAGNOSIS — Z1211 Encounter for screening for malignant neoplasm of colon: Secondary | ICD-10-CM | POA: Diagnosis not present

## 2023-02-01 DIAGNOSIS — E7800 Pure hypercholesterolemia, unspecified: Secondary | ICD-10-CM

## 2023-02-01 DIAGNOSIS — Z1231 Encounter for screening mammogram for malignant neoplasm of breast: Secondary | ICD-10-CM

## 2023-02-01 DIAGNOSIS — Z131 Encounter for screening for diabetes mellitus: Secondary | ICD-10-CM | POA: Diagnosis not present

## 2023-02-01 DIAGNOSIS — Z2821 Immunization not carried out because of patient refusal: Secondary | ICD-10-CM | POA: Insufficient documentation

## 2023-02-01 MED ORDER — HYDROCHLOROTHIAZIDE 25 MG PO TABS: ORAL_TABLET | ORAL | 3 refills | Status: DC

## 2023-02-01 NOTE — Patient Instructions (Addendum)
If you are interested in the new shingles vaccine (Shingrix) - call your local pharmacy to check on coverage and availability   Try to get 1200-1500 mg of calcium per day with at least 2000 iu of vitamin D - for bone health   Also check with your pharmacy about tetanus shot because you are due (Td)    Keep walking  Add some strength training to your routine, this is important for bone and brain health and can reduce your risk of falls and help your body use insulin properly and regulate weight  Light weights, exercise bands , and internet videos are a good way to start  Yoga (chair or regular), machines , floor exercises or a gym with machines are also good options     For cholesterol Avoid red meat/ fried foods/ egg yolks/ fatty breakfast meats/ butter, cheese and high fat dairy/ and shellfish  Cut back on shrimp     I do highly recommend a yearly flu shot  Also a one time pneumonia shot   You have an order for:  []   2D Mammogram  [x]   3D Mammogram  [x]   Bone Density     Please call for appointment:   [x]   [facility]  [address] [place] [address]  [phone]  []   [facility])   [address]  [place], [place] [address]  [phone]  []   [facility]      [address] [place], [place]        [phone]         []   [facility]  [address] [place], [place]  [phone]  []  [facility]   [address]   [place], [place] [address]  [phone]  []  [facility]  [address] [place], [place] [address] [phone]    Make sure to wear two piece clothing  No lotions powders or deodorants the day of the appointment Make sure to bring picture ID and insurance card.  Bring list of medications you  are currently taking including any supplements.   Schedule your screening mammogram through MyChart!   Select [place] imaging sites can now be scheduled through MyChart.  Log into your MyChart account.  Go to 'Visit' (or 'Appointments' if  on mobile App) --> Schedule an  Appointment  Under 'Select a Reason for Visit' choose the Mammogram  Screening option.  Complete the pre-visit questions  and select the time and place that  best fits your schedule         Mediterranean Diet  Why follow it? Research shows. Those who follow the Mediterranean diet have a reduced risk of heart disease  The diet is associated with a reduced incidence of Parkinson's and Alzheimer's diseases People following the diet may have longer life expectancies and lower rates of chronic diseases  The Dietary Guidelines for Americans recommends the Mediterranean diet as an eating plan to promote health and prevent disease  What Is the Mediterranean Diet?  Healthy eating plan based on typical foods and recipes of Mediterranean-style cooking The diet is primarily a plant based diet; these foods should make up a majority of meals   Starches - Plant based foods should  make up a majority of meals - They are an important sources of vitamins, minerals, energy, antioxidants, and fiber - Choose whole grains, foods high in fiber and minimally processed items  - Typical grain sources include wheat, oats, barley, corn, brown [place], bulgar, farro, millet, polenta, couscous  - Various types of beans include chickpeas, lentils, fava beans, black beans, white beans   Fruits  Veggies - Large quantities of antioxidant rich fruits & veggies; 6 or more servings  - Vegetables can be eaten raw or lightly drizzled with oil and cooked  - Vegetables common to the traditional Mediterranean Diet include: artichokes, arugula, beets, broccoli, brussel sprouts, cabbage, carrots, celery, collard greens, cucumbers, eggplant, kale,  leeks, lemons, lettuce, mushrooms, okra, onions, peas, peppers, potatoes, pumpkin, radishes, rutabaga, shallots, spinach, sweet potatoes, turnips, zucchini - Fruits common to the Mediterranean Diet include: apples, apricots, avocados, cherries, clementines, dates, figs, grapefruits, grapes, melons, nectarines, oranges, peaches, pears, pomegranates, strawberries, tangerines  Fats - Replace butter and margarine with healthy oils, such as olive oil, canola oil, and tahini  - Limit nuts to no more than a handful a day  - Nuts include walnuts, almonds, pecans, pistachios, pine nuts  - Limit or avoid candied, honey roasted or heavily salted nuts - Olives are central to the [employer] - can be eaten whole or used in a variety of dishes   Meats Protein - Limiting red meat: no more than a few times a month - When eating red meat: choose lean cuts and keep the portion to the size of deck of cards - Eggs: approx. 0 to 4 times a week  - Fish and lean poultry: at least 2 a week  - Healthy protein sources include, chicken, [place], lean beef, lamb - Increase intake of seafood such as tuna, salmon, trout, mackerel, shrimp, scallops - Avoid or limit high fat processed meats such as sausage and bacon  Dairy - Include moderate amounts of low fat dairy products  - Focus on healthy dairy such as fat free yogurt, skim milk, low or reduced fat cheese - Limit dairy products higher in fat such as whole or 2% milk, cheese, ice cream  Alcohol - Moderate amounts of red wine is ok  - No more than 5 oz daily for women (all ages) and men older than age 33  - No more than 10 oz of wine daily for men younger than 63  Other - Limit sweets and other desserts  - Use herbs and spices instead of salt to flavor foods  - Herbs and spices common to the traditional Mediterranean Diet include: basil, bay leaves, chives, cloves, cumin, fennel, garlic, lavender, marjoram, mint, oregano, parsley, pepper, rosemary, sage, savory,  sumac, tarragon, thyme   It's not just a diet, it's a lifestyle:  The Mediterranean diet includes lifestyle factors typical of those in the region  Foods, drinks and meals are best eaten with others and savored Daily physical activity is important for overall good health This could be strenuous exercise like running and aerobics This could also be more leisurely activities such as walking, housework, yard-work, or taking the stairs Moderation is the key; a balanced and healthy diet accommodates most foods and drinks Consider portion sizes and frequency of consumption of certain foods   Meal Ideas & Options:  Breakfast:  Whole wheat toast or whole wheat English muffins with peanut butter & hard boiled egg Steel cut oats topped with apples & cinnamon and skim milk  Fresh fruit: banana,  strawberries, melon, berries, peaches  Smoothies: strawberries, bananas, greek yogurt, peanut butter Low fat greek yogurt with blueberries and granola  Egg white omelet with spinach and mushrooms Breakfast couscous: whole wheat couscous, apricots, skim milk, cranberries  Sandwiches:  Hummus and grilled vegetables (peppers, zucchini, squash) on whole wheat bread   Grilled chicken on whole wheat pita with lettuce, tomatoes, cucumbers or tzatziki  [place] salad on whole wheat bread: tuna salad made with greek yogurt, olives, red peppers, capers, green onions Garlic rosemary lamb pita: lamb sauted with garlic, rosemary, salt & pepper; add lettuce, cucumber, greek yogurt to pita - flavor with lemon juice and black pepper  Seafood:  Mediterranean grilled salmon, seasoned with garlic, basil, parsley, lemon juice and black pepper Shrimp, lemon, and spinach whole-grain pasta salad made with low fat greek yogurt  Seared scallops with lemon orzo  Seared tuna steaks seasoned salt, pepper, coriander topped with tomato mixture of olives, tomatoes, olive oil, minced garlic, parsley, green onions and cappers  Meats:  Herbed  greek chicken salad with kalamata olives, cucumber, feta  Red bell peppers stuffed with spinach, bulgur, lean ground beef (or lentils) & topped with feta   Kebabs: skewers of chicken, tomatoes, onions, zucchini, squash  [place] burgers: made with red onions, mint, dill, lemon juice, feta cheese topped with roasted red peppers Vegetarian Cucumber salad: cucumbers, artichoke hearts, celery, red onion, feta cheese, tossed in olive oil & lemon juice  Hummus and whole grain pita points with a greek salad (lettuce, tomato, feta, olives, cucumbers, red onion) Lentil soup with celery, carrots made with vegetable broth, garlic, salt and pepper  Tabouli salad: parsley, bulgur, mint, scallions, cucumbers, tomato, radishes, lemon juice, olive oil, salt and pepper.

## 2023-02-01 NOTE — Assessment & Plan Note (Signed)
Disc goals for lipids and reasons to control them Rev last labs with pt Rev low sat fat diet in detail Fairly stable LDL 136 Intol of one statin and declines any further treatment (of any kind)  ASCVD score 11.1%- pt is aware

## 2023-02-01 NOTE — Assessment & Plan Note (Signed)
Pt declines all immunizations except tetanus shot which she plans to get at pharmacy  Discussed pros/cons and risks

## 2023-02-01 NOTE — Assessment & Plan Note (Signed)
Lab Results  Component Value Date   HGBA1C 5.5 01/27/2023   disc imp of low glycemic diet and wt loss to prevent DM2

## 2023-02-01 NOTE — Assessment & Plan Note (Signed)
bp in fair control at this time  BP Readings from Last 1 Encounters:  02/01/23 134/76   No changes needed Most recent labs reviewed  Disc lifstyle change with low sodium diet and exercise  Plan to continue hctz 25 mg daily

## 2023-02-01 NOTE — Assessment & Plan Note (Signed)
Cologaurd neg 11/2021 Pt declines colonoscopy for primary screening

## 2023-02-01 NOTE — Assessment & Plan Note (Signed)
Mammogram ordered °Pt will call to schedule  °

## 2023-02-01 NOTE — Assessment & Plan Note (Signed)
Dexa ordered

## 2023-02-01 NOTE — Progress Notes (Signed)
Subjective:    Patient ID: Teresa Stephens, female    DOB: 1956-02-07, 67 y.o.   MRN: [id number]  HPI Here for annual follow up of chronic medical problem   Wt Readings from Last 3 Encounters:  02/01/23 201 lb 3.2 oz (91.3 kg)  11/17/21 191 lb 9.6 oz (86.9 kg)  08/18/20 195 lb 9 oz (88.7 kg)   35.64 kg/m  Vitals:   02/01/23 0808  BP: 134/76  Pulse: 73  Temp: 98.1 F (36.7 C)  SpO2: 96%    Immunization History  Administered Date(s) Administered   Influenza Split 04/12/2011   PFIZER(Purple Top)SARS-COV-2 Vaccination 09/13/2019, 10/08/2019   Td 10/25/2002   Tdap 08/21/2012    Health Maintenance Due  Topic Date Due   Medicare Annual Wellness (AWV)  Never done   DEXA SCAN  Never done   DTaP/Tdap/Td (3 - Td or Tdap) 08/22/2022   MAMMOGRAM  12/02/2022   Shingrix - declines now   Prevnar vaccine - declines   Flu shot - declines (never)   Tetanus shot - may consider at pharmacy   [facility]ogram- 11/[facility] Self breast exam-no lumps   Gyn health No problems    Colon cancer screening Cologuard 11/2021   Bone health  Dexa -will schedule  Falls-had one skating / did not break anything / has left sore wrist  Fractures-none  Supplements -none  Exercise - walking  Has gym membership when weather is bad    Mood    02/01/2023    8:52 AM 11/17/2021    9:34 AM 08/18/2020    8:26 AM 03/18/2019   10:49 AM 02/12/2018    9:46 AM  Depression screen PHQ 2/9  Decreased Interest 0 0 0 0 0  Down, Depressed, Hopeless 1 0 1 0 0  PHQ - 2 Score 1 0 1 0 0  Altered sleeping 1  1    Tired, decreased energy 1  1    Change in appetite 0  0    Feeling bad or failure about yourself  0  0    Trouble concentrating 0  0    Moving slowly or fidgety/restless 0  0    Suicidal thoughts 0  0    PHQ-9 Score 3  3    Difficult doing work/chores Somewhat difficult       HTN bp is stable today  No cp or palpitations or headaches or edema  No side effects to medicines  BP  Readings from Last 3 Encounters:  02/01/23 134/76  11/17/21 135/80  08/18/20 138/75    Hydrochlorothiazide 25 mg daily   Lab Results  Component Value Date   NA 140 01/27/2023   K 3.7 01/27/2023   CO2 34 (H) 01/27/2023   GLUCOSE 82 01/27/2023   BUN 17 01/27/2023   CREATININE 0.81 01/27/2023   CALCIUM 9.8 01/27/2023   GFR 75.72 01/27/2023   GFRNONAA 91.18 11/19/2009   Lab Results  Component Value Date   ALT 10 01/27/2023   AST 17 01/27/2023   ALKPHOS 70 01/27/2023   BILITOT 1.0 01/27/2023   Lab Results  Component Value Date   TSH 4.00 01/27/2023   Lab Results  Component Value Date   WBC 5.6 01/27/2023   HGB 13.2 01/27/2023   HCT 39.9 01/27/2023   MCV 91.8 01/27/2023   PLT 314.0 01/27/2023    Hyperlipidemia Lab Results  Component Value Date   CHOL 208 (H) 01/27/2023   CHOL 213 (H) 11/12/2021   CHOL  205 (H) 08/12/2020   Lab Results  Component Value Date   HDL 55.90 01/27/2023   HDL 55.90 11/12/2021   HDL 51.80 08/12/2020   Lab Results  Component Value Date   LDLCALC 136 (H) 01/27/2023   LDLCALC 144 (H) 11/12/2021   LDLCALC 140 (H) 08/12/2020   Lab Results  Component Value Date   TRIG 80.0 01/27/2023   TRIG 61.0 11/12/2021   TRIG 63.0 08/12/2020   Lab Results  Component Value Date   CHOLHDL 4 01/27/2023   CHOLHDL 4 11/12/2021   CHOLHDL 4 08/12/2020   Lab Results  Component Value Date   LDLDIRECT 125.2 11/19/2009   In past crestor caused nausea  Declines any other treatments whatsoever   The 10-year ASCVD risk score (Arnett DK, et al., 2019) is: 11.1%   Values used to calculate the score:     Age: 73 years     Sex: Female     Is Non-Hispanic African American: Yes     Diabetic: No     Tobacco smoker: No     Systolic Blood Pressure: 134 mmHg     Is BP treated: Yes     HDL Cholesterol: 55.9 mg/dL     Total Cholesterol: 208 mg/dL  Eats too much shrimp     DM screen  Lab Results  Component Value Date   HGBA1C 5.5 01/27/2023        Patient Active Problem List   Diagnosis Date Noted   Immunization declined 02/01/2023   Diabetes mellitus screening 01/09/2023   Estrogen deficiency 11/17/2021   Obesity (BMI 30-39.9) 02/12/2018   Encounter for screening mammogram for breast cancer 11/29/2016   Hyperlipidemia 11/29/2016   Colon cancer screening 08/21/2012   Encounter for routine gynecological examination 08/21/2012   Routine general medical examination at a health care facility 03/07/2011   NONSPEC ELEVATION OF LEVELS OF TRANSAMINASE/LDH 04/24/2007   Essential hypertension 01/01/2007   Past Medical History:  Diagnosis Date   ASCUS (atypical squamous cells of undetermined significance) on Pap smear 1997   ok on repeat (10/98)   GERD (gastroesophageal reflux disease)    Helicobacter pylori infection    past history   HTN (hypertension)    No past surgical history on file. Social History   Tobacco Use   Smoking status: Never   Smokeless tobacco: Never  Substance Use Topics   Alcohol use: Yes    Alcohol/week: 0.0 standard drinks of alcohol    Comment: Occasional wine   Drug use: No   Family History  Problem Relation Age of Onset   Kidney disease Father    Hypertension Father    Diabetes Father    Heart attack Mother    Hypertension Mother    Lung cancer Other        uncle   Breast cancer Cousin 5   Allergies  Allergen Reactions   Cephalexin     REACTION: GI   Crestor [Rosuvastatin]     Nausea    Nizatidine     REACTION: nausea   Wasp Venom     Local reaction   Current Outpatient Medications on File Prior to Visit  Medication Sig Dispense Refill   ibuprofen (ADVIL,MOTRIN) 200 MG tablet Take 200 mg by mouth every 6 (six) hours as needed.     No current facility-administered medications on file prior to visit.    Review of Systems  Constitutional:  Negative for activity change, appetite change, fatigue, fever and unexpected weight change.  HENT:  Negative for congestion, ear  pain, rhinorrhea, sinus pressure and sore throat.   Eyes:  Negative for pain, redness and visual disturbance.  Respiratory:  Negative for cough, shortness of breath and wheezing.   Cardiovascular:  Negative for chest pain and palpitations.  Gastrointestinal:  Negative for abdominal pain, blood in stool, constipation and diarrhea.  Endocrine: Negative for polydipsia and polyuria.  Genitourinary:  Negative for dysuria, frequency and urgency.  Musculoskeletal:  Negative for arthralgias, back pain and myalgias.  Skin:  Negative for pallor and rash.  Allergic/Immunologic: Negative for environmental allergies.  Neurological:  Negative for dizziness, syncope and headaches.  Hematological:  Negative for adenopathy. Does not bruise/bleed easily.  Psychiatric/Behavioral:  Negative for decreased concentration and dysphoric mood. The patient is not nervous/anxious.        Objective:   Physical Exam Constitutional:      General: She is not in acute distress.    Appearance: Normal appearance. She is well-developed. She is obese. She is not ill-appearing or diaphoretic.  HENT:     Head: Normocephalic and atraumatic.     Right Ear: Tympanic membrane, ear canal and external ear normal.     Left Ear: Tympanic membrane, ear canal and external ear normal.     Nose: Nose normal. No congestion.     Mouth/Throat:     Mouth: Mucous membranes are moist.     Pharynx: Oropharynx is clear. No posterior oropharyngeal erythema.  Eyes:     General: No scleral icterus.    Extraocular Movements: Extraocular movements intact.     Conjunctiva/sclera: Conjunctivae normal.     Pupils: Pupils are equal, round, and reactive to light.  Neck:     Thyroid: No thyromegaly.     Vascular: No carotid bruit or JVD.  Cardiovascular:     Rate and Rhythm: Normal rate and regular rhythm.     Pulses: Normal pulses.     Heart sounds: Normal heart sounds.     No gallop.  Pulmonary:     Effort: Pulmonary effort is normal. No  respiratory distress.     Breath sounds: Normal breath sounds. No wheezing.     Comments: Good air exch Chest:     Chest wall: No tenderness.  Abdominal:     General: Bowel sounds are normal. There is no distension or abdominal bruit.     Palpations: Abdomen is soft. There is no mass.     Tenderness: There is no abdominal tenderness.     Hernia: No hernia is present.  Genitourinary:    Comments: Breast exam: No mass, nodules, thickening, tenderness, bulging, retraction, inflamation, nipple discharge or skin changes noted.  No axillary or clavicular LA.     Musculoskeletal:        General: No tenderness. Normal range of motion.     Cervical back: Normal range of motion and neck supple. No rigidity. No muscular tenderness.     Right lower leg: No edema.     Left lower leg: No edema.     Comments: No kyphosis   Lymphadenopathy:     Cervical: No cervical adenopathy.  Skin:    General: Skin is warm and dry.     Coloration: Skin is not pale.     Findings: No erythema or rash.  Neurological:     Mental Status: She is alert. Mental status is at baseline.     Cranial Nerves: No cranial nerve deficit.     Motor: No abnormal muscle tone.  Coordination: Coordination normal.     Gait: Gait normal.     Deep Tendon Reflexes: Reflexes are normal and symmetric. Reflexes normal.  Psychiatric:        Mood and Affect: Mood normal.        Cognition and Memory: Cognition and memory normal.           Assessment & Plan:   Problem List Items Addressed This Visit       Cardiovascular and Mediastinum   Essential hypertension - Primary    bp in fair control at this time  BP Readings from Last 1 Encounters:  02/01/23 134/76   No changes needed Most recent labs reviewed  Disc lifstyle change with low sodium diet and exercise  Plan to continue hctz 25 mg daily       Relevant Medications   hydrochlorothiazide (HYDRODIURIL) 25 MG tablet     Other   Colon cancer screening    Cologaurd  neg 11/2021 Pt declines colonoscopy for primary screening       Diabetes mellitus screening    Lab Results  Component Value Date   HGBA1C 5.5 01/27/2023   disc imp of low glycemic diet and wt loss to prevent DM2       Encounter for screening mammogram for breast cancer    Mammogram ordered  Pt will call to schedule       Relevant Orders   MM 3D SCREENING MAMMOGRAM BILATERAL BREAST   Estrogen deficiency    Dexa ordered       Relevant Orders   DG Bone Density   Hyperlipidemia    Disc goals for lipids and reasons to control them Rev last labs with pt Rev low sat fat diet in detail Fairly stable LDL 136 Intol of one statin and declines any further treatment (of any kind)  ASCVD score 11.1%- pt is aware        Relevant Medications   hydrochlorothiazide (HYDRODIURIL) 25 MG tablet   Immunization declined    Pt declines all immunizations except tetanus shot which she plans to get at pharmacy  Discussed pros/cons and risks       Obesity (BMI 30-39.9)    Discussed how this problem influences overall health and the risks it imposes  Reviewed plan for weight loss with lower calorie diet (via better food choices (lower glycemic and portion control) along with exercise building up to or more than 30 minutes 5 days per week including some aerobic activity and strength training

## 2023-02-01 NOTE — Assessment & Plan Note (Signed)
 Discussed how this problem influences overall health and the risks it imposes  Reviewed plan for weight loss with lower calorie diet (via better food choices (lower glycemic and portion control) along with exercise building up to or more than 30 minutes 5 days per week including some aerobic activity and strength training

## 2023-04-24 ENCOUNTER — Ambulatory Visit
Admission: RE | Admit: 2023-04-24 | Discharge: 2023-04-24 | Disposition: A | Discharge status: Home / Self Care | Payer: Medicare Other | Source: Ambulatory Visit | Attending: Family Medicine | Admitting: Family Medicine

## 2023-04-24 ENCOUNTER — Encounter: Payer: Self-pay | Admitting: Family Medicine

## 2023-04-24 DIAGNOSIS — E2839 Other primary ovarian failure: Secondary | ICD-10-CM | POA: Insufficient documentation

## 2023-04-24 DIAGNOSIS — Z1231 Encounter for screening mammogram for malignant neoplasm of breast: Secondary | ICD-10-CM | POA: Insufficient documentation

## 2023-04-24 DIAGNOSIS — M8580 Other specified disorders of bone density and structure, unspecified site: Secondary | ICD-10-CM | POA: Insufficient documentation

## 2023-07-25 ENCOUNTER — Ambulatory Visit (INDEPENDENT_AMBULATORY_CARE_PROVIDER_SITE_OTHER): Payer: Medicare Other

## 2023-07-25 VITALS — Ht 63.0 in | Wt 199.0 lb

## 2023-07-25 DIAGNOSIS — Z0000 Encounter for general adult medical examination without abnormal findings: Secondary | ICD-10-CM | POA: Diagnosis not present

## 2023-07-25 NOTE — Patient Instructions (Signed)
 Teresa Stephens , Thank you for taking time to come for your Medicare Wellness Visit. I appreciate your ongoing commitment to your health goals. Please review the following plan we discussed and let me know if I can assist you in the future.   Referrals/Orders/Follow-Ups/Clinician Recommendations: none  This is a list of the screening recommended for you and due dates:  Health Maintenance  Topic Date Due   Hepatitis C Screening  Never done   Zoster (Shingles) Vaccine (1 of 2) Never done   DTaP/Tdap/Td vaccine (3 - Td or Tdap) 08/22/2022   COVID-19 Vaccine (3 - 2024-25 season) 01/22/2023   Flu Shot  08/21/2023*   Pneumonia Vaccine (1 of 1 - PCV) 02/01/2024*   Mammogram  04/23/2024   Medicare Annual Wellness Visit  07/24/2024   Cologuard (Stool DNA test)  12/01/2024   DEXA scan (bone density measurement)  Completed   HPV Vaccine  Aged Out   Stool Blood Test  Discontinued  *Topic was postponed. The date shown is not the original due date.    Advanced directives: (Declined) Advance directive discussed with you today. Even though you declined this today, please call our office should you change your mind, and we can give you the proper paperwork for you to fill out.  Next Medicare Annual Wellness Visit scheduled for next year: Yes 07/26/24 @ 10:10am telephone

## 2023-07-25 NOTE — Progress Notes (Signed)
 Subjective:   Teresa Stephens is a 68 y.o. who presents for a Initial Medicare Wellness preventive visit.  Visit Complete: Virtual I connected with  Teresa Stephens on 07/25/23 by a video and audio enabled telemedicine application and verified that I am speaking with the correct person using two identifiers.  Patient Location: Home  Provider Location: Home Office  I discussed the limitations of evaluation and management by telemedicine. The patient expressed understanding and agreed to proceed.  Vital Signs: Because this visit was a virtual/telehealth visit, some criteria may be missing or patient reported. Any vitals not documented were not able to be obtained and vitals that have been documented are patient reported.  AWV Questionnaire: Yes: Patient Medicare AWV questionnaire was completed by the patient on 07/24/23; I have confirmed that all information answered by patient is correct and no changes since this date. Cardiac Risk Factors include: advanced age (>58men, >21 women);dyslipidemia;hypertension;obesity (BMI >30kg/m2);sedentary lifestyle    Objective:    Today's Vitals   07/25/23 0853  Weight: 199 lb (90.3 kg)  Height: 5\' 3"  (1.6 m)   Body mass index is 35.25 kg/m.     07/25/2023    9:05 AM  Advanced Directives  Does Patient Have a Medical Advance Directive? No    Current Medications (verified) Outpatient Encounter Medications as of 07/25/2023  Medication Sig   hydrochlorothiazide (HYDRODIURIL) 25 MG tablet TAKE 1 TABLET BY MOUTH EVERY DAY   ibuprofen (ADVIL,MOTRIN) 200 MG tablet Take 200 mg by mouth every 6 (six) hours as needed.   No facility-administered encounter medications on file as of 07/25/2023.    Allergies (verified) Cephalexin, Crestor [rosuvastatin], Nizatidine, and Wasp venom   History: Past Medical History:  Diagnosis Date   ASCUS (atypical squamous cells of undetermined significance) on Pap smear 1997   ok on repeat (10/98)   GERD  (gastroesophageal reflux disease)    Helicobacter pylori infection    past history   HTN (hypertension)    No past surgical history on file. Family History  Problem Relation Age of Onset   Kidney disease Father    Hypertension Father    Diabetes Father    Heart attack Mother    Hypertension Mother    Lung cancer Other        uncle   Breast cancer Cousin 20   Social History   Socioeconomic History   Marital status: Widowed    Spouse name: Not on file   Number of children: 2   Years of education: Not on file   Highest education level: Associate degree: academic program  Occupational History    Employer: [employer]  Tobacco Use   Smoking status: Never   Smokeless tobacco: Never  Substance and Sexual Activity   Alcohol use: Yes    Alcohol/week: 0.0 standard drinks of alcohol    Comment: Occasional wine   Drug use: No   Sexual activity: Not on file  Other Topics Concern   Not on file  Social History Narrative   Married; 2 children      Gym for exercise      [employer]   Social Drivers of Health   Financial Resource Strain: Low Risk  (07/24/2023)   Overall Financial Resource Strain (CARDIA)    Difficulty of Paying Living Expenses: Not very hard  Food Insecurity: No Food Insecurity (07/24/2023)   Hunger Vital Sign    Worried About Running Out of Food in the Last Year: Never true  Ran Out of Food in the Last Year: Never true  Transportation Needs: No Transportation Needs (07/24/2023)   PRAPARE - Administrator, Civil Service (Medical): No    Lack of Transportation (Non-Medical): No  Physical Activity: Insufficiently Active (07/24/2023)   Exercise Vital Sign    Days of Exercise per Week: 2 days    Minutes of Exercise per Session: 40 min  Stress: No Stress Concern Present (07/24/2023)   [employer] of Occupational Health - Occupational Stress Questionnaire    Feeling of Stress : Only a little  Social Connections: Moderately Integrated (07/24/2023)   Social  Connection and Isolation Panel [NHANES]    Frequency of Communication with Friends and Family: Twice a week    Frequency of Social Gatherings with Friends and Family: Three times a week    Attends Religious Services: More than 4 times per year    Active Member of Clubs or Organizations: Yes    Attends Banker Meetings: More than 4 times per year    Marital Status: Widowed    Tobacco Counseling Counseling given: Not Answered   Clinical Intake:  Pre-visit preparation completed: Yes  Pain : No/denies pain    BMI - recorded: 35.25 Nutritional Status: BMI > 30  Obese Nutritional Risks: None  How often do you need to have someone help you when you read instructions, pamphlets, or other written materials from your doctor or pharmacy?: 1 - Never  Interpreter Needed?: No  Comments: daughter and granddaughter recently lives with pt Information entered by :: B.Violia Knopf,LPN   Activities of Daily Living     07/24/2023    9:51 AM  In your present state of health, do you have any difficulty performing the following activities:  Hearing? 0  Vision? 0  Difficulty concentrating or making decisions? 0  Walking or climbing stairs? 0  Dressing or bathing? 0  Doing errands, shopping? 0  Using the Toilet? N  In the past six months, have you accidently leaked urine? N  Do you have problems with loss of bowel control? N  Managing your Medications? N  Managing your Finances? N  Housekeeping or managing your Housekeeping? N    Patient Care Team: Tower, Audrie Gallus, MD as PCP - General  Indicate any recent Medical Services you may have received from other than [facility] providers in the past year (date may be approximate).     Assessment:   This is a routine wellness examination for Teresa Stephens.  Hearing/Vision screen Hearing Screening - Comments:: Pt says her hearing is great;ringing in ear sometimes but does not affect hearing Vision Screening - Comments:: Pt says wears readers but  good Dr Dion Body   Goals Addressed             This Visit's Progress    Weight (lb) < 175 lb (79.4 kg)   199 lb (90.3 kg)    I would like to lose 25lb.       Depression Screen     07/25/2023    9:00 AM 02/01/2023    8:52 AM 11/17/2021    9:34 AM 08/18/2020    8:26 AM 03/18/2019   10:49 AM 02/12/2018    9:46 AM 11/29/2016    9:08 AM  PHQ 2/9 Scores  PHQ - 2 Score 0 1 0 1 0 0 2  PHQ- 9 Score  3  3   4     Fall Risk     07/24/2023    9:51 AM 02/01/2023  8:52 AM 11/17/2021    9:33 AM  Fall Risk   Falls in the past year? 1 1 0  Number falls in past yr: 0 0   Injury with Fall? 0 0   Risk for fall due to : No Fall Risks No Fall Risks   Follow up Education provided;Falls prevention discussed Education provided Falls evaluation completed    MEDICARE RISK AT HOME:  Medicare Risk at Home Any stairs in or around the home?: (Patient-Rptd) Yes If so, are there any without handrails?: (Patient-Rptd) No Home free of loose throw rugs in walkways, pet beds, electrical cords, etc?: (Patient-Rptd) Yes Adequate lighting in your home to reduce risk of falls?: (Patient-Rptd) Yes Life alert?: (Patient-Rptd) No Use of a cane, walker or w/c?: (Patient-Rptd) No Grab bars in the bathroom?: (Patient-Rptd) No Shower chair or bench in shower?: (Patient-Rptd) No Elevated toilet seat or a handicapped toilet?: (Patient-Rptd) No  TIMED UP AND GO:  Was the test performed?  No  Cognitive Function: 6CIT completed        07/25/2023    9:10 AM  6CIT Screen  What Year? 0 points  What month? 0 points  What time? 0 points  Count back from 20 0 points  Months in reverse 0 points  Repeat phrase 0 points  Total Score 0 points    Immunizations Immunization History  Administered Date(s) Administered   Influenza Split 04/12/2011   PFIZER(Purple Top)SARS-COV-2 Vaccination 09/13/2019, 10/08/2019   Td 10/25/2002   Tdap 08/21/2012    Screening Tests Health Maintenance  Topic Date Due    Hepatitis C Screening  Never done   Zoster Vaccines- Shingrix (1 of 2) Never done   DTaP/Tdap/Td (3 - Td or Tdap) 08/22/2022   COVID-19 Vaccine (3 - 2024-25 season) 01/22/2023   INFLUENZA VACCINE  08/21/2023 (Originally 12/22/2022)   Pneumonia Vaccine 72+ Years old (1 of 1 - PCV) 02/01/2024 (Originally 10/17/2021)   MAMMOGRAM  04/23/2024   Medicare Annual Wellness (AWV)  07/24/2024   Fecal DNA (Cologuard)  12/01/2024   DEXA SCAN  Completed   HPV VACCINES  Aged Out   COLON CANCER SCREENING ANNUAL FOBT  Discontinued    Health Maintenance  Health Maintenance Due  Topic Date Due   Hepatitis C Screening  Never done   Zoster Vaccines- Shingrix (1 of 2) Never done   DTaP/Tdap/Td (3 - Td or Tdap) 08/22/2022   COVID-19 Vaccine (3 - 2024-25 season) 01/22/2023   Health Maintenance Items Addressedall/none needed at this time  Additional Screening:  Vision Screening: Recommended annual ophthalmology exams for early detection of glaucoma and other disorders of the eye.  Dental Screening: Recommended annual dental exams for proper oral hygiene  Community Resource Referral / Chronic Care Management: CRR required this visit?  No   CCM required this visit?  No     Plan:     I have personally reviewed and noted the following in the patient's chart:   Medical and social history Use of alcohol, tobacco or illicit drugs  Current medications and supplements including opioid prescriptions. Patient is not currently taking opioid prescriptions. Functional ability and status Nutritional status Physical activity Advanced directives List of other physicians Hospitalizations, surgeries, and ER visits in previous 12 months Vitals Screenings to include cognitive, depression, and falls Referrals and appointments  In addition, I have reviewed and discussed with patient certain preventive protocols, quality metrics, and best practice recommendations. A written personalized care plan for preventive  services as well as general preventive  health recommendations were provided to patient.    Sue Lush, LPN   08/22/3242   After Visit Summary: (MyChart) Due to this being a telephonic visit, the after visit summary with patients personalized plan was offered to patient via MyChart   Notes: Nothing significant to report at this time. Pt will schedule PE w/PCP in mychart after last (02/01/24)

## 2024-03-07 ENCOUNTER — Other Ambulatory Visit: Payer: Self-pay | Admitting: Family Medicine

## 2024-03-07 DIAGNOSIS — I10 Essential (primary) hypertension: Secondary | ICD-10-CM

## 2024-06-08 ENCOUNTER — Other Ambulatory Visit: Payer: Self-pay | Admitting: Family Medicine

## 2024-06-08 DIAGNOSIS — I10 Essential (primary) hypertension: Secondary | ICD-10-CM

## 2024-06-10 NOTE — Telephone Encounter (Signed)
 Pt hasn't been seen since 01/2023, pt needs a CPE/ labs prior, please schedule and then route back to me to refill

## 2024-06-17 NOTE — Telephone Encounter (Signed)
 Patient has called regarding her refill. I did advise that she needs an CPE/labs before it can be done. I had to send a message to [facility] for them to schedule for her due to it only allowing me to schedule an AWV for her.

## 2024-06-18 NOTE — Telephone Encounter (Signed)
 Med refilled.

## 2024-07-12 ENCOUNTER — Other Ambulatory Visit

## 2024-07-19 ENCOUNTER — Encounter: Admitting: Family Medicine

## 2024-07-26 ENCOUNTER — Ambulatory Visit
